# Patient Record
Sex: Male | Born: 1960 | Race: White | Hispanic: No | Marital: Single | Smoking: Never smoker
Health system: Southern US, Community
[De-identification: ages and names within clinical notes are randomized; demographics above are authoritative.]

## PROBLEM LIST (undated history)

## (undated) DIAGNOSIS — E119 Type 2 diabetes mellitus without complications: Secondary | ICD-10-CM

## (undated) DIAGNOSIS — I10 Essential (primary) hypertension: Secondary | ICD-10-CM

## (undated) DIAGNOSIS — K219 Gastro-esophageal reflux disease without esophagitis: Secondary | ICD-10-CM

---

## 2005-10-04 ENCOUNTER — Emergency Department: Payer: Self-pay | Admitting: Emergency Medicine

## 2011-12-04 ENCOUNTER — Emergency Department: Payer: Self-pay

## 2014-03-15 ENCOUNTER — Ambulatory Visit: Payer: Self-pay | Admitting: Family Medicine

## 2015-05-10 ENCOUNTER — Ambulatory Visit
Admission: RE | Admit: 2015-05-10 | Discharge: 2015-05-10 | Disposition: A | Discharge status: Home / Self Care | Payer: BLUE CROSS/BLUE SHIELD | Source: Ambulatory Visit | Attending: Preventative Medicine | Admitting: Preventative Medicine

## 2015-05-10 ENCOUNTER — Other Ambulatory Visit: Payer: Self-pay | Admitting: Preventative Medicine

## 2015-05-10 DIAGNOSIS — M79601 Pain in right arm: Secondary | ICD-10-CM

## 2016-02-03 IMAGING — CR RIGHT FOOT COMPLETE - 3+ VIEW
1 series · 3 of 3 positions shown · non-contrast
Comparison: None.

CLINICAL DATA: RIGHT foot pain. Initial encounter. Foot pain for 2
weeks. No injury. Lateral foot pain.

EXAM:
RIGHT FOOT COMPLETE - 3+ VIEW

[Series 1: dxr foot rt complete w/obliques · 0.14mm/px · 3 of 3 slices shown]
[im 1/3]
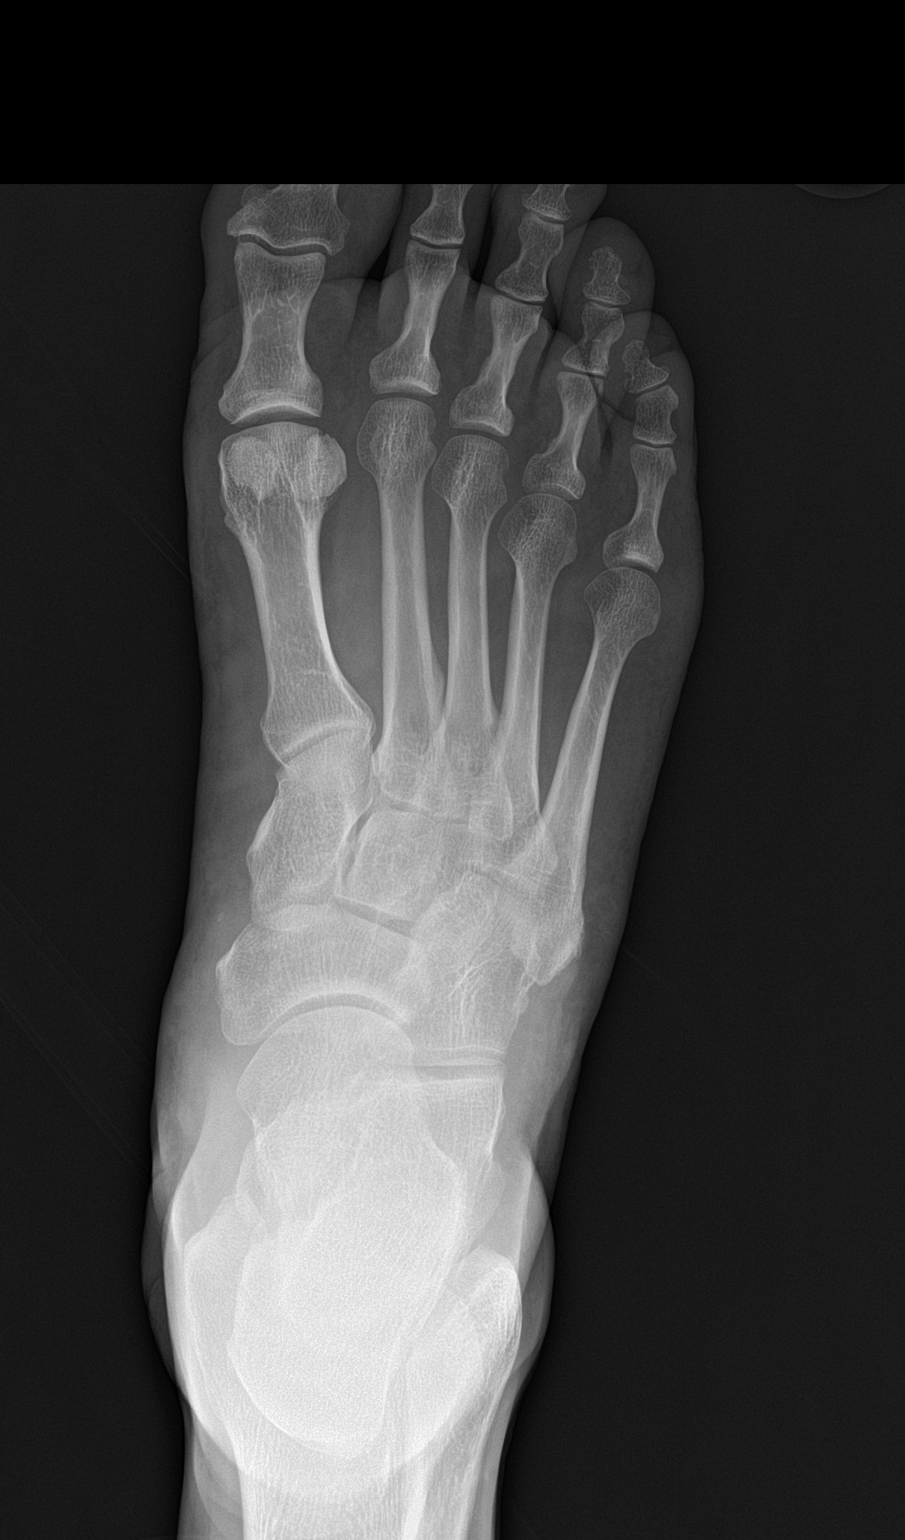
[im 2/3]
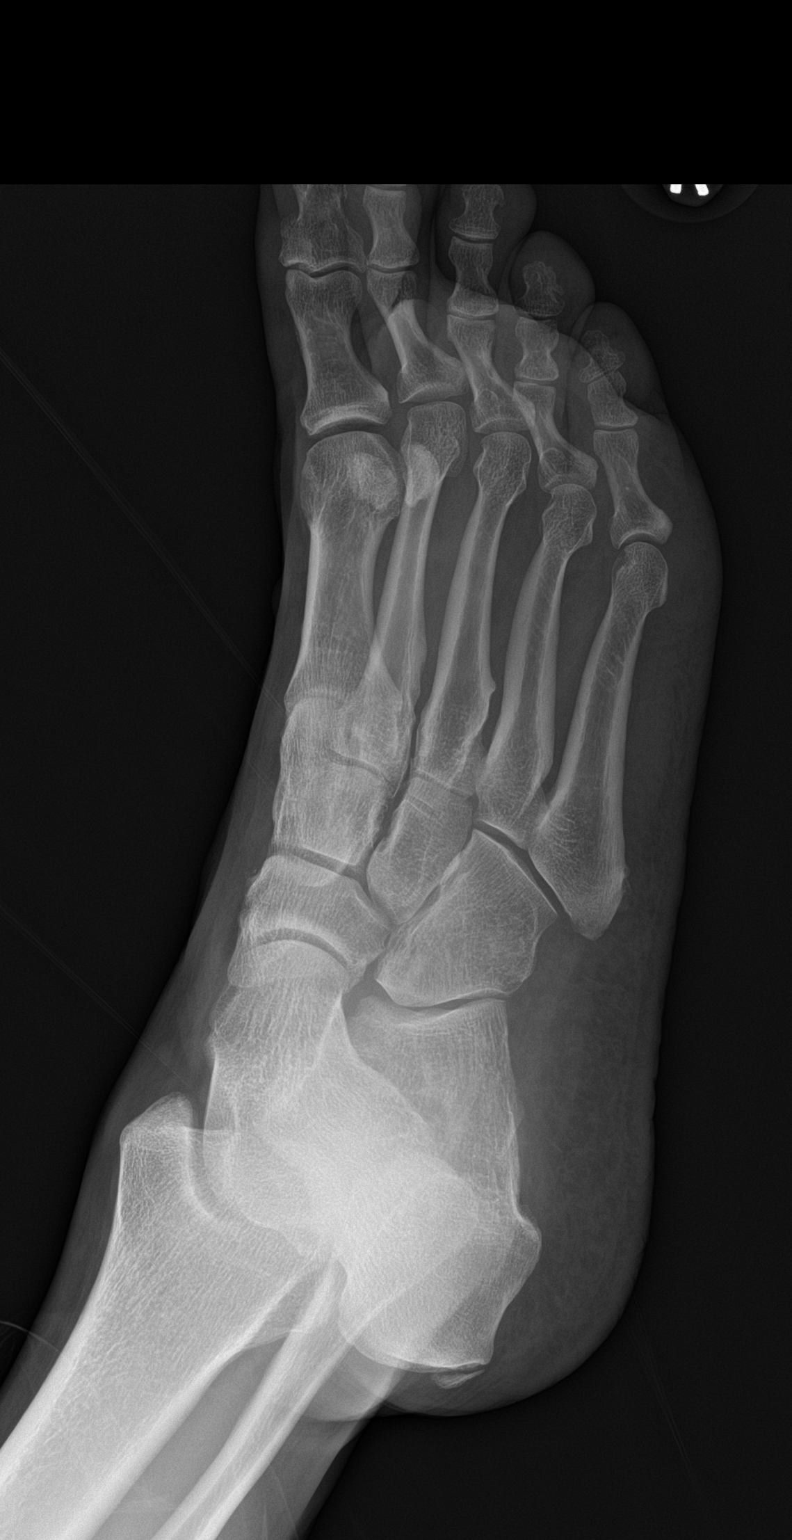
[im 3/3]
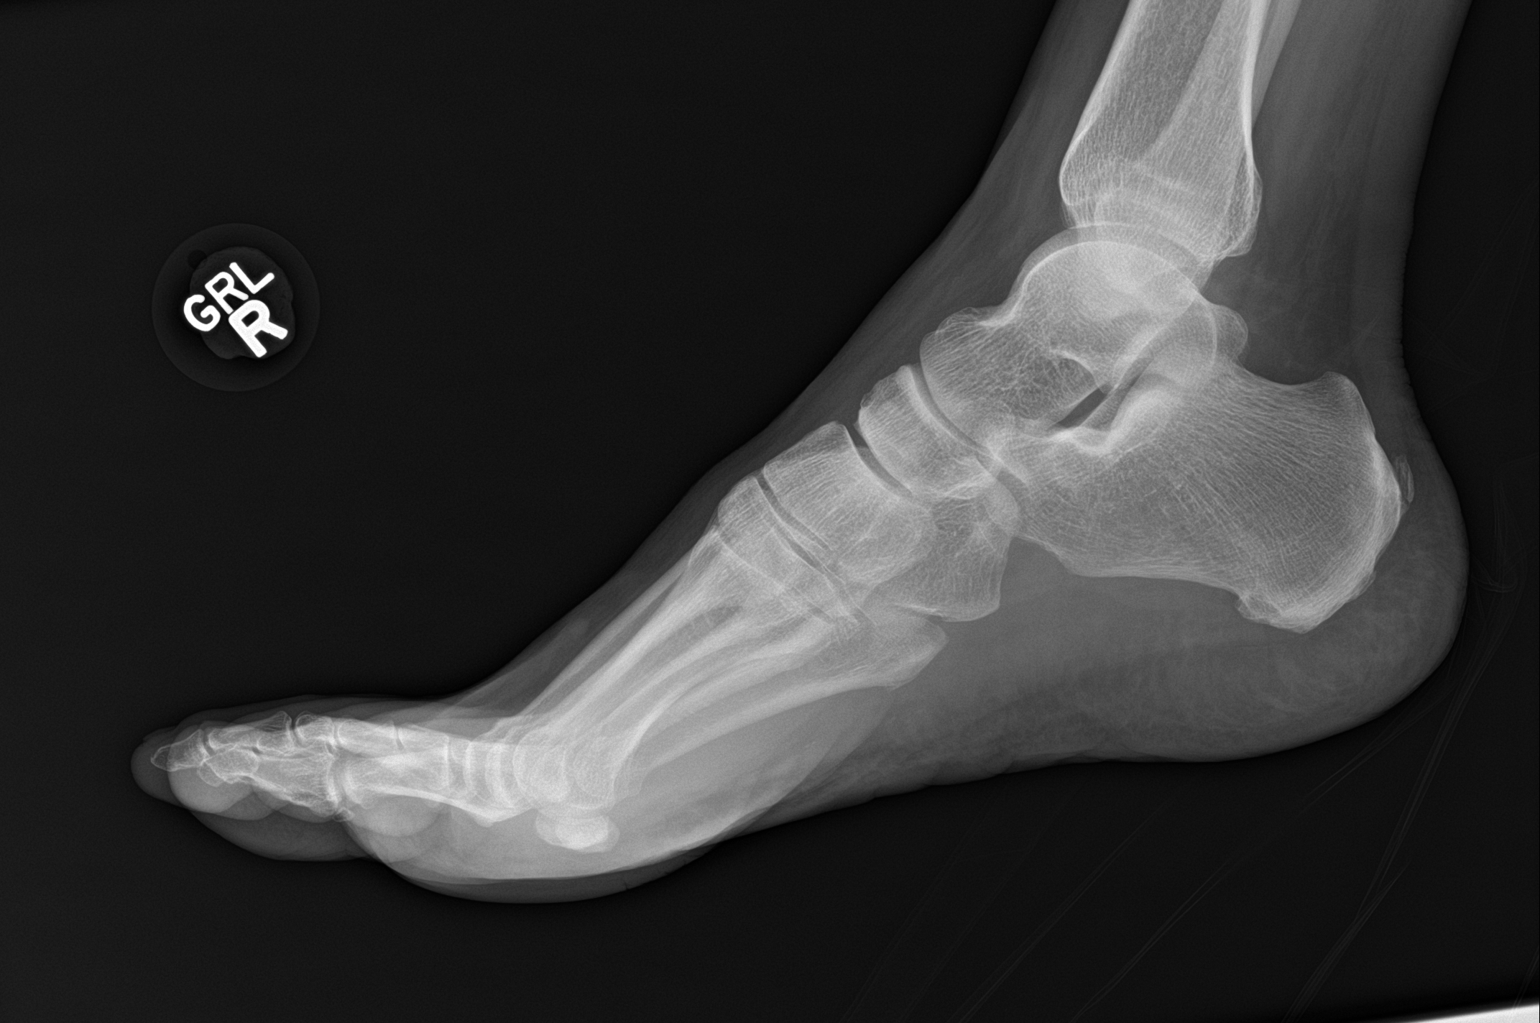

[3 of 3 positions shown; findings below may reference images not displayed]

FINDINGS: Negative for fracture or acute osseous abnormality. The alignment is
anatomic. Joint spaces are preserved. Soft tissues appear within
normal limits. No periosteal reaction or stress fracture identified
in the metatarsals.
IMPRESSION: Negative.

## 2017-03-30 IMAGING — CR DG HUMERUS 2V *R*
1 series · 2 of 2 positions shown · non-contrast
Comparison: None.

CLINICAL DATA: Lateral left humeral pain for 4-5 weeks. No known
injury.

EXAM:
RIGHT HUMERUS - 2+ VIEW

[Series 1: dg humerus right · 0.14mm/px · 2 of 2 slices shown]
[im 1/2]
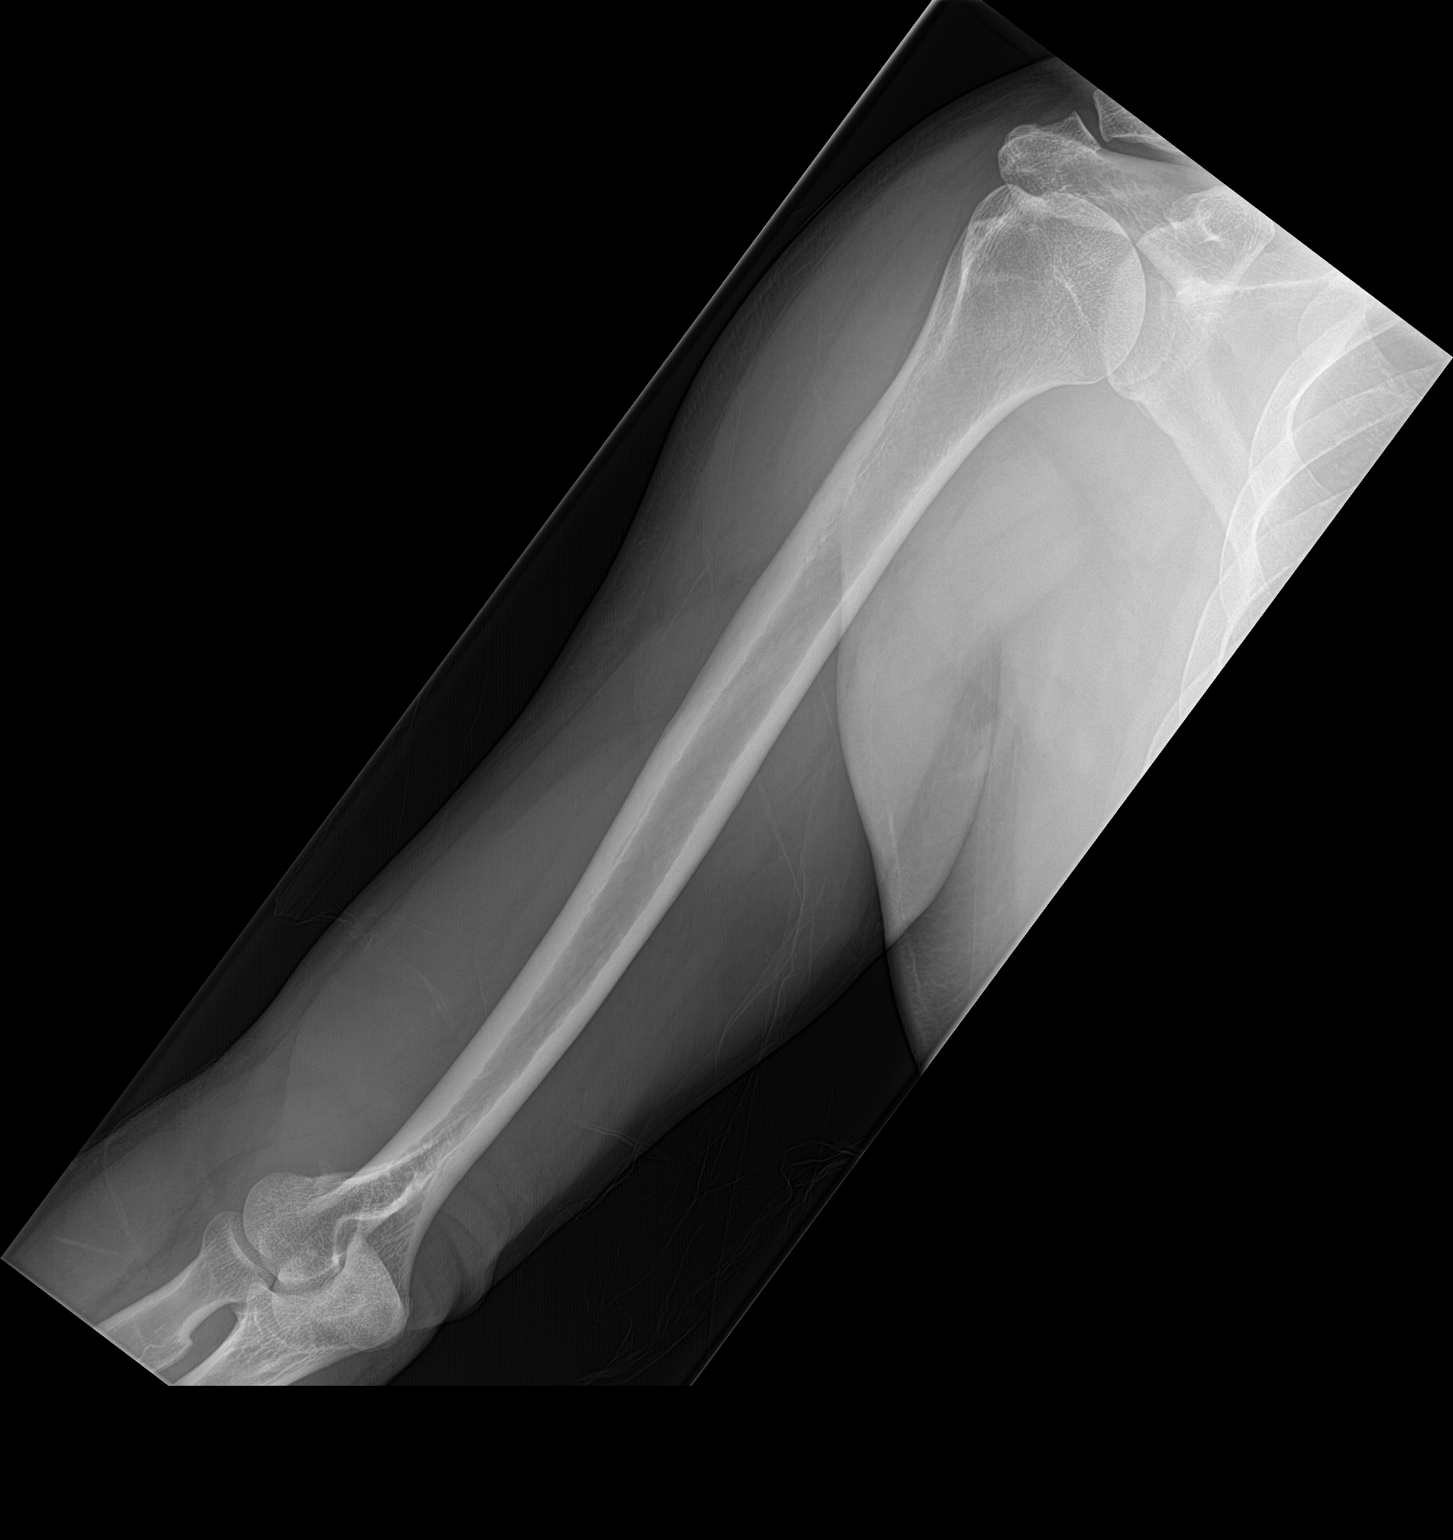
[im 2/2]
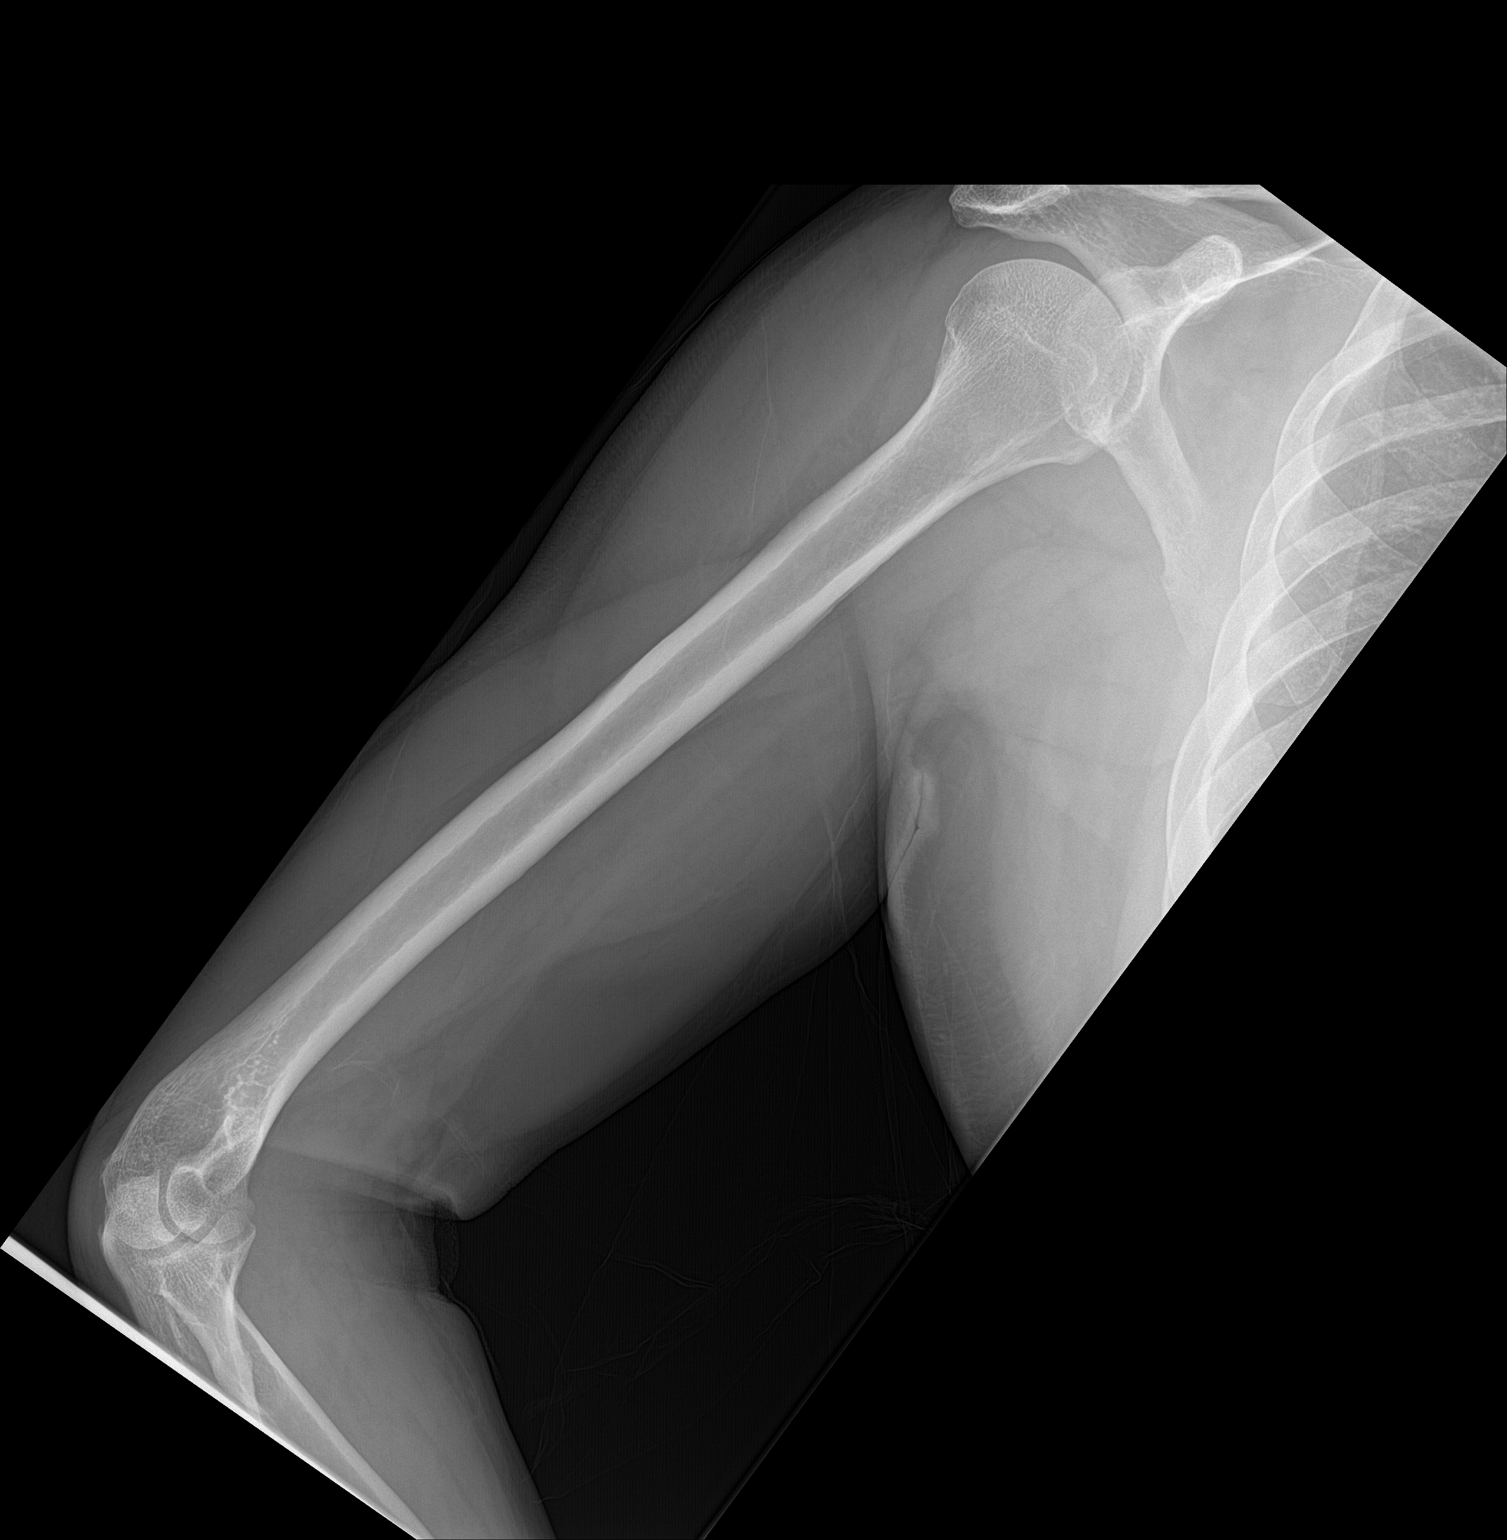

[2 of 2 positions shown; findings below may reference images not displayed]

FINDINGS: There is no evidence of fracture or other focal bone lesions. Soft
tissues are unremarkable.
IMPRESSION: Negative.

## 2017-08-19 ENCOUNTER — Emergency Department: Payer: BLUE CROSS/BLUE SHIELD

## 2017-08-19 ENCOUNTER — Emergency Department
Admission: EM | Admit: 2017-08-19 | Discharge: 2017-08-19 | Disposition: A | Discharge status: Home / Self Care | Payer: BLUE CROSS/BLUE SHIELD | Attending: Emergency Medicine | Admitting: Emergency Medicine

## 2017-08-19 ENCOUNTER — Encounter: Payer: Self-pay | Admitting: Emergency Medicine

## 2017-08-19 ENCOUNTER — Other Ambulatory Visit: Payer: Self-pay

## 2017-08-19 DIAGNOSIS — R05 Cough: Secondary | ICD-10-CM | POA: Diagnosis not present

## 2017-08-19 DIAGNOSIS — R059 Cough, unspecified: Secondary | ICD-10-CM

## 2017-08-19 HISTORY — DX: Gastro-esophageal reflux disease without esophagitis: K21.9

## 2017-08-19 MED ORDER — HYDROCOD POLST-CPM POLST ER 10-8 MG/5ML PO SUER
5.0000 mL | Freq: Once | ORAL | Status: AC
Start: 2017-08-19 — End: 2017-08-19
  Administered 2017-08-19: 5 mL via ORAL
  Filled 2017-08-19: qty 5

## 2017-08-19 MED ORDER — HYDROCOD POLST-CPM POLST ER 10-8 MG/5ML PO SUER: 5.0000 mL | Freq: Two times a day (BID) | ORAL | 0 refills | Status: DC

## 2017-08-19 NOTE — ED Notes (Signed)
Patient transported to X-ray 

## 2017-08-19 NOTE — ED Triage Notes (Signed)
Pt in via POV with complaints of persistent cough since Friday.  Pt denies any accompanying symptoms.  NAD noted at this time.

## 2017-08-19 NOTE — ED Notes (Signed)
Pt states cough since Friday, denies production, states he took nyquil without relief. Pt states some watery eyes and head pain when coughing, denies smoking hx. Pt states sometimes he felt he may have had a fever but is unsure. Pt in NAD at this time, equal and unlabored resp noted.

## 2017-08-19 NOTE — ED Provider Notes (Signed)
[facility] Emergency Department Provider Note   ____________________________________________   First MD Initiated Contact with Patient 08/19/17 0235     (approximate)  I have reviewed the triage vital signs and the nursing notes.   HISTORY  Chief Complaint Cough    HPI Carl Crawford is a 57 y.o. male who presents to the ED from home with a chief complaint of cough.  Patient reports persistent and nonproductive cough x3 days.  Also notes some sinus congestion secondary to seasonal allergies.  Denies associated fever, chills, chest pain, shortness of breath, abdominal pain, nausea or vomiting.  Denies recent travel or trauma.   Past Medical History:  Diagnosis Date  . Acid reflux disease     There are no active problems to display for this patient.   History reviewed. No pertinent surgical history.  Prior to Admission medications   Medication Sig Start Date End Date Taking? Authorizing Provider  chlorpheniramine-HYDROcodone (TUSSIONEX PENNKINETIC ER) 10-8 MG/5ML SUER Take 5 mLs by mouth 2 (two) times daily. 08/19/17   Paulette Blanch, MD    Allergies Patient has no known allergies.  No family history on file.  Social History Social History   Tobacco Use  . Smoking status: Never Smoker  . Smokeless tobacco: Never Used  Substance Use Topics  . Alcohol use: Not Currently  . Drug use: Not Currently    Review of Systems  Constitutional: No fever/chills Eyes: No visual changes. ENT: Positive for sinus congestion.  No sore throat. Cardiovascular: Denies chest pain. Respiratory: Positive for dry cough.  Denies shortness of breath. Gastrointestinal: No abdominal pain.  No nausea, no vomiting.  No diarrhea.  No constipation. Genitourinary: Negative for dysuria. Musculoskeletal: Negative for back pain. Skin: Negative for rash. Neurological: Negative for headaches, focal weakness or  numbness.   ____________________________________________   PHYSICAL EXAM:  VITAL SIGNS: ED Triage Vitals  Enc Vitals Group     BP 08/19/17 0017 (!) 173/99     Pulse Rate 08/19/17 0017 84     Resp 08/19/17 0017 17     Temp 08/19/17 0017 99.3 F (37.4 C)     Temp Source 08/19/17 0017 Oral     SpO2 08/19/17 0017 97 %     Weight 08/19/17 0010 206 lb (93.4 kg)     Height 08/19/17 0010 6\' 1"  (1.854 m)     Head Circumference --      Peak Flow --      Pain Score 08/19/17 0010 0     Pain Loc --      Pain Edu? --      Excl. in [place]? --     Constitutional: Alert and oriented. Well appearing and in no acute distress. Eyes: Conjunctivae are normal. PERRL. EOMI. Head: Atraumatic. Nose: No congestion/rhinnorhea. Mouth/Throat: Mucous membranes are moist.  Oropharynx non-erythematous. Neck: No stridor.  Supple neck without meningismus. Cardiovascular: Normal rate, regular rhythm. Grossly normal heart sounds.  Good peripheral circulation. Respiratory: Normal respiratory effort.  No retractions. Lungs CTAB.  No cough noted. Gastrointestinal: Soft and nontender. No distention. No abdominal bruits. No CVA tenderness. Musculoskeletal: No lower extremity tenderness nor edema.  No joint effusions. Neurologic:  Normal speech and language. No gross focal neurologic deficits are appreciated. No gait instability. Skin:  Skin is warm, dry and intact. No rash noted. Psychiatric: Mood and affect are normal. Speech and behavior are normal.  ____________________________________________   LABS (all labs ordered are listed, but only abnormal results are displayed)  Labs Reviewed - No data to display ____________________________________________  EKG  None ____________________________________________  RADIOLOGY  ED MD interpretation: No acute cardiopulmonary process  Official radiology report(s): Dg Chest 2 View  Result Date: 08/19/2017 CLINICAL DATA:  Persistent cough since Friday. EXAM:  CHEST - 2 VIEW COMPARISON:  None. FINDINGS: The heart size and mediastinal contours are within normal limits. Both lungs are clear. The visualized skeletal structures are unremarkable. IMPRESSION: No active cardiopulmonary disease. Electronically Signed   By: Lucienne Capers M.D.   On: 08/19/2017 01:15    ____________________________________________   PROCEDURES  Procedure(s) performed: None  Procedures  Critical Care performed: No  ____________________________________________   INITIAL IMPRESSION / ASSESSMENT AND PLAN / ED COURSE  As part of my medical decision making, I reviewed the following data within the [place] notes reviewed and incorporated, Old chart reviewed, Radiograph reviewed and Notes from prior ED visits   57 year old male who presents with dry cough x3 days.  Advised patient to start a daily allergy pill.  Will discharge home with prescription for Tussionex to use as needed.  Strict return precautions given.  Patient verbalizes understanding agrees with plan of care.      ____________________________________________   FINAL CLINICAL IMPRESSION(S) / ED DIAGNOSES  Final diagnoses:  Cough     ED Discharge Orders        Ordered    chlorpheniramine-HYDROcodone (TUSSIONEX PENNKINETIC ER) 10-8 MG/5ML SUER  2 times daily     08/19/17 0245       Note:  This document was prepared using Dragon voice recognition software and may include unintentional dictation errors.    Paulette Blanch, MD 08/19/17 0500

## 2017-08-19 NOTE — Discharge Instructions (Addendum)
1.  Start a daily over-the-counter allergy medicine (Zyrtec, Claritin, Allegra, etc.). 2.  You may take Tussionex as needed for cough. 3.  Return to the ER for worsening symptoms, persistent vomiting, difficulty breathing or other concerns.

## 2017-08-19 NOTE — ED Notes (Signed)

## 2021-05-10 ENCOUNTER — Encounter: Payer: Self-pay | Admitting: Physician Assistant

## 2021-05-10 ENCOUNTER — Ambulatory Visit (INDEPENDENT_AMBULATORY_CARE_PROVIDER_SITE_OTHER): Payer: 59 | Admitting: Physician Assistant

## 2021-05-10 VITALS — BP 158/94 | HR 68 | Temp 97.9°F | Resp 16 | Ht 73.0 in | Wt 212.0 lb

## 2021-05-10 DIAGNOSIS — I10 Essential (primary) hypertension: Secondary | ICD-10-CM | POA: Diagnosis not present

## 2021-05-10 DIAGNOSIS — Z125 Encounter for screening for malignant neoplasm of prostate: Secondary | ICD-10-CM

## 2021-05-10 DIAGNOSIS — R194 Change in bowel habit: Secondary | ICD-10-CM

## 2021-05-10 DIAGNOSIS — Z1211 Encounter for screening for malignant neoplasm of colon: Secondary | ICD-10-CM | POA: Diagnosis not present

## 2021-05-10 DIAGNOSIS — Z6827 Body mass index (BMI) 27.0-27.9, adult: Secondary | ICD-10-CM

## 2021-05-10 DIAGNOSIS — Z0000 Encounter for general adult medical examination without abnormal findings: Secondary | ICD-10-CM | POA: Diagnosis not present

## 2021-05-10 DIAGNOSIS — L918 Other hypertrophic disorders of the skin: Secondary | ICD-10-CM | POA: Diagnosis not present

## 2021-05-10 DIAGNOSIS — Z8249 Family history of ischemic heart disease and other diseases of the circulatory system: Secondary | ICD-10-CM | POA: Diagnosis not present

## 2021-05-10 MED ORDER — LOSARTAN POTASSIUM 25 MG PO TABS: 25.0000 mg | ORAL_TABLET | Freq: Every day | ORAL | 1 refills | Status: DC

## 2021-05-10 MED ORDER — TRIAMCINOLONE ACETONIDE 0.1 % EX CREA: 1.0000 "application " | TOPICAL_CREAM | Freq: Two times a day (BID) | CUTANEOUS | 2 refills | Status: AC

## 2021-05-10 NOTE — Assessment & Plan Note (Signed)
Many across chest/neck, underarms.  ?As they are itchy--rx triamcinolone cream  ?Ref to derm ?

## 2021-05-10 NOTE — Assessment & Plan Note (Signed)
May be within his normal? ?Advised caution with fatty/greasy/heavy foods ?Will do labs ?Ref to GI as needs baseline colonoscopy ?

## 2021-05-10 NOTE — Assessment & Plan Note (Signed)
Recommending lipid panel, pt is fasting, a1c, cmp ?

## 2021-05-10 NOTE — Progress Notes (Signed)
? ? ?I,Roshena L Chambers,acting as a scribe for [employer], PA-C.,have documented all relevant documentation on the behalf of Carl Kirschner, PA-C,as directed by  Carl Kirschner, PA-C while in the presence of Carl Kirschner, PA-C.  ? ?New patient visit ? ? ?Patient: Carl Crawford   DOB: 11/03/1960   61 y.o. Male  MRN: [id number] ?Visit Date: 05/10/2021 ? ?Today's healthcare provider: [name], PA-C  ? ?Chief Complaint  ?Patient presents with  ? Establish Care  ? Skin Tag  ? ?Subjective  ?  ?Carl Crawford is a 61 y.o. male who presents today as a new patient to establish care. He reports his last PCP was at [facility] clinic. His last visit at [facility] clinic was several years ago. Reports history of high blood pressure, once on medication. ? ?HPI  ?Skin Tags: ?Patient has several skin tags on his neck that itch. He would like to discuss having then removed. Denies any other predominant lesions. ? ?Reports frequent BM for the last several months-years. He will have solid, 'normal' BM 4-5 times a day. Seems to happen more with greasy food. Denies light colored or dark/black stools, denies visible blood, denies diarrhea, denies abdominal pain/cramping.  ? ?Past Medical History:  ?Diagnosis Date  ? Acid reflux disease   ? ?History reviewed. No pertinent surgical history. ?Family Status  ?Relation Name Status  ? Father  (Not Specified)  ? Sister  (Not Specified)  ? MGF  (Not Specified)  ? ?Family History  ?Problem Relation Age of Onset  ? Heart attack Father   ? Cancer Sister   ? Cirrhosis Sister   ? Heart attack Maternal Grandfather   ? ?Social History  ? ?Socioeconomic History  ? Marital status: Single  ?  Spouse name: Not on file  ? Number of children: Not on file  ? Years of education: Not on file  ? Highest education level: Not on file  ?Occupational History  ? Not on file  ?Tobacco Use  ? Smoking status: Never  ? Smokeless tobacco: Never  ?Vaping Use  ? Vaping Use: Never used  ?Substance and Sexual Activity  ?  Alcohol use: Never  ? Drug use: Not Currently  ? Sexual activity: Not on file  ?Other Topics Concern  ? Not on file  ?Social History Narrative  ? Not on file  ? ?Social Determinants of Health  ? ?Financial Resource Strain: Not on file  ?Food Insecurity: Not on file  ?Transportation Needs: Not on file  ?Physical Activity: Not on file  ?Stress: Not on file  ?Social Connections: Not on file  ? ?Outpatient Medications Prior to Visit  ?Medication Sig  ? esomeprazole (NEXIUM) 20 MG capsule Take 20 mg by mouth daily at 12 noon.  ? Multiple Vitamins-Minerals (MENS MULTIVITAMIN PO) Take by mouth.  ? [DISCONTINUED] chlorpheniramine-HYDROcodone (TUSSIONEX PENNKINETIC ER) 10-8 MG/5ML SUER Take 5 mLs by mouth 2 (two) times daily.  ? ?No facility-administered medications prior to visit.  ? ?No Known Allergies ? ? ?There is no immunization history on file for this patient. ? ?Health Maintenance  ?Topic Date Due  ? COVID-19 Vaccine (1) Never done  ? HIV Screening  Never done  ? Hepatitis C Screening  Never done  ? TETANUS/TDAP  Never done  ? COLONOSCOPY (Pts 45-52yr Insurance coverage will need to be confirmed)  Never done  ? Zoster Vaccines- Shingrix (1 of 2) Never done  ? INFLUENZA VACCINE  08/28/2021  ? HPV VACCINES  Aged Out  ? ? ?  Patient Care Team: ?Emelia Loron as PCP - General (Physician Assistant) ? ?Review of Systems  ?Constitutional:  Negative for appetite change, chills, fatigue and fever.  ?HENT:  Negative for congestion, ear pain, hearing loss, nosebleeds and trouble swallowing.   ?Eyes:  Negative for pain and visual disturbance.  ?Respiratory:  Negative for cough, chest tightness and shortness of breath.   ?Cardiovascular:  Negative for chest pain, palpitations and leg swelling.  ?Gastrointestinal:  Negative for abdominal pain, blood in stool, constipation, diarrhea, nausea and vomiting.  ?Endocrine: Negative for polydipsia, polyphagia and polyuria.  ?Genitourinary:  Negative for dysuria and flank pain.   ?Musculoskeletal:  Negative for arthralgias, back pain, joint swelling, myalgias and neck stiffness.  ?Skin:  Negative for color change, rash and wound.  ?     Multiple skin tags  ?Neurological:  Negative for dizziness, tremors, seizures, speech difficulty, weakness, light-headedness and headaches.  ?Psychiatric/Behavioral:  Negative for behavioral problems, confusion, decreased concentration, dysphoric mood and sleep disturbance. The patient is not nervous/anxious.   ?All other systems reviewed and are negative. ? ? ? Objective  ?  ?BP (!) 158/94   Pulse 68   Temp 97.9 ?F (36.6 ?C) (Oral)   Resp 16   Ht '6\' 1"'$  (1.854 m)   Wt 212 lb (96.2 kg)   SpO2 98% Comment: room air  BMI 27.97 kg/m?  ? ?Physical Exam ?Constitutional:   ?   General: He is awake.  ?   Appearance: He is well-developed.  ?HENT:  ?   Head: Normocephalic.  ?Eyes:  ?   Conjunctiva/sclera: Conjunctivae normal.  ?Cardiovascular:  ?   Rate and Rhythm: Normal rate and regular rhythm.  ?   Heart sounds: Normal heart sounds.  ?Pulmonary:  ?   Effort: Pulmonary effort is normal.  ?   Breath sounds: Normal breath sounds.  ?Musculoskeletal:  ?   Right lower leg: No edema.  ?   Left lower leg: No edema.  ?Skin: ?   General: Skin is warm.  ?   Comments: Multiple small skin tags across neck  ?Neurological:  ?   Mental Status: He is alert and oriented to person, place, and time.  ?Psychiatric:     ?   Attention and Perception: Attention normal.     ?   Mood and Affect: Mood normal.     ?   Speech: Speech normal.     ?   Behavior: Behavior is cooperative.  ? ? ?Depression Screen ? ?  05/10/2021  ?  9:04 AM  ?PHQ 2/9 Scores  ?PHQ - 2 Score 0  ?PHQ- 9 Score 0  ? ?No results found for any visits on 05/10/21. ? Assessment & Plan   ?  ? ?Problem List Items Addressed This Visit   ? ?  ? Cardiovascular and Mediastinum  ? Primary hypertension  ?  Historically, been unmediated. ?Starting losartan 25 mg daily, advised at bedtime.  ?Pt not able to check BP at home ?F/u  4 weeks ?  ?  ? Relevant Medications  ? losartan (COZAAR) 25 MG tablet  ? Other Relevant Orders  ? Lipid Profile  ? Comprehensive Metabolic Panel (CMET)  ?  ? Musculoskeletal and Integument  ? Skin tags, multiple acquired  ?  Many across chest/neck, underarms.  ?As they are itchy--rx triamcinolone cream  ?Ref to derm ?  ?  ? Relevant Medications  ? triamcinolone cream (KENALOG) 0.1 %  ? Other Relevant Orders  ? Ambulatory referral to  Dermatology  ?  ? Other  ? Family history of heart disease  ?  Recommending lipid panel, pt is fasting, a1c, cmp ?  ?  ? Relevant Orders  ? Lipid Profile  ? Frequent bowel movements  ?  May be within his normal? ?Advised caution with fatty/greasy/heavy foods ?Will do labs ?Ref to GI as needs baseline colonoscopy ?  ?  ? ?Other Visit Diagnoses   ? ? Encounter for medical examination to establish care    -  Primary  ? Relevant Medications  ? losartan (COZAAR) 25 MG tablet  ? Other Relevant Orders  ? CBC w/Diff/Platelet  ? Lipid Profile  ? HgB A1c  ? Comprehensive Metabolic Panel (CMET)  ? Ambulatory referral to Dermatology  ? PSA  ? BMI 27.0-27.9,adult      ? Relevant Orders  ? Lipid Profile  ? HgB A1c  ? Screening for prostate cancer      ? Relevant Orders  ? PSA  ? Colon cancer screening      ? Relevant Orders  ? Ambulatory referral to Gastroenterology  ? ?  ?  ?Return in about 4 weeks (around 06/07/2021) for hypertension.  ? We will attempt to obtain prior vaccine history with other pcps ? ?I, Carl Kirschner, PA-C have reviewed all documentation for this visit. The documentation on  05/10/2021 for the exam, diagnosis, procedures, and orders are all accurate and complete. ? ?Carl Kirschner, PA-C ?[facility] ?[address] ?[place], [place], [address] ?Office: [phone] ?Fax: [phone]  ? ?[facility]  ?

## 2021-05-10 NOTE — Assessment & Plan Note (Addendum)
Historically, been unmedicated. Elevated today initially and on repeat. ?Starting losartan 25 mg daily, advised at bedtime.  ?Pt not able to check BP at home ?F/u 4 weeks ?

## 2021-05-11 ENCOUNTER — Telehealth: Payer: Self-pay

## 2021-05-11 LAB — CBC WITH DIFFERENTIAL/PLATELET
Basophils Absolute: 0 10*3/uL (ref 0.0–0.2)
Basos: 1 %
EOS (ABSOLUTE): 0.1 10*3/uL (ref 0.0–0.4)
Eos: 2 %
Hematocrit: 46.6 % (ref 37.5–51.0)
Hemoglobin: 16.8 g/dL (ref 13.0–17.7)
Immature Grans (Abs): 0 10*3/uL (ref 0.0–0.1)
Immature Granulocytes: 0 %
Lymphocytes Absolute: 1.6 10*3/uL (ref 0.7–3.1)
Lymphs: 33 %
MCH: 30.9 pg (ref 26.6–33.0)
MCHC: 36.1 g/dL — ABNORMAL HIGH (ref 31.5–35.7)
MCV: 86 fL (ref 79–97)
Monocytes Absolute: 0.6 10*3/uL (ref 0.1–0.9)
Monocytes: 11 %
Neutrophils Absolute: 2.6 10*3/uL (ref 1.4–7.0)
Neutrophils: 53 %
Platelets: 283 10*3/uL (ref 150–450)
RBC: 5.43 x10E6/uL (ref 4.14–5.80)
RDW: 12.4 % (ref 11.6–15.4)
WBC: 4.9 10*3/uL (ref 3.4–10.8)

## 2021-05-11 LAB — LIPID PANEL
Chol/HDL Ratio: 5.5 ratio — ABNORMAL HIGH (ref 0.0–5.0)
Cholesterol, Total: 248 mg/dL — ABNORMAL HIGH (ref 100–199)
HDL: 45 mg/dL (ref >39)
LDL Chol Calc (NIH): 186 mg/dL — ABNORMAL HIGH (ref 0–99)
Triglycerides: 97 mg/dL (ref 0–149)
VLDL Cholesterol Cal: 17 mg/dL (ref 5–40)

## 2021-05-11 LAB — COMPREHENSIVE METABOLIC PANEL
ALT: 90 IU/L — ABNORMAL HIGH (ref 0–44)
AST: 54 IU/L — ABNORMAL HIGH (ref 0–40)
Albumin/Globulin Ratio: 1.8 (ref 1.2–2.2)
Albumin: 5 g/dL — ABNORMAL HIGH (ref 3.8–4.8)
Alkaline Phosphatase: 92 IU/L (ref 44–121)
BUN/Creatinine Ratio: 9 — ABNORMAL LOW (ref 10–24)
BUN: 7 mg/dL — ABNORMAL LOW (ref 8–27)
Bilirubin Total: 0.4 mg/dL (ref 0.0–1.2)
CO2: 22 mmol/L (ref 20–29)
Calcium: 9.9 mg/dL (ref 8.6–10.2)
Chloride: 102 mmol/L (ref 96–106)
Creatinine, Ser: 0.82 mg/dL (ref 0.76–1.27)
Globulin, Total: 2.8 g/dL (ref 1.5–4.5)
Glucose: 125 mg/dL — ABNORMAL HIGH (ref 70–99)
Potassium: 5 mmol/L (ref 3.5–5.2)
Sodium: 143 mmol/L (ref 134–144)
Total Protein: 7.8 g/dL (ref 6.0–8.5)
eGFR: 99 mL/min/{1.73_m2} (ref >59)

## 2021-05-11 LAB — PSA: Prostate Specific Ag, Serum: 0.5 ng/mL (ref 0.0–4.0)

## 2021-05-11 LAB — HEMOGLOBIN A1C
Est. average glucose Bld gHb Est-mCnc: 131 mg/dL
Hgb A1c MFr Bld: 6.2 % — ABNORMAL HIGH (ref 4.8–5.6)

## 2021-05-11 NOTE — Telephone Encounter (Signed)
CALLED PATIENT NO ANSWER LEFT VOICEMAIL FOR A CALL BACK ? ?

## 2021-05-14 ENCOUNTER — Other Ambulatory Visit: Payer: Self-pay | Admitting: Physician Assistant

## 2021-05-14 ENCOUNTER — Telehealth: Payer: Self-pay

## 2021-05-14 DIAGNOSIS — E782 Mixed hyperlipidemia: Secondary | ICD-10-CM

## 2021-05-14 MED ORDER — ATORVASTATIN CALCIUM 20 MG PO TABS: 20.0000 mg | ORAL_TABLET | Freq: Every day | ORAL | 3 refills | Status: DC

## 2021-05-14 NOTE — Telephone Encounter (Signed)
LVM for pt to return my call.

## 2021-05-15 ENCOUNTER — Other Ambulatory Visit: Payer: Self-pay

## 2021-05-15 DIAGNOSIS — Z1211 Encounter for screening for malignant neoplasm of colon: Secondary | ICD-10-CM

## 2021-05-15 MED ORDER — PEG 3350-KCL-NA BICARB-NACL 420 G PO SOLR: 4000.0000 mL | Freq: Once | ORAL | 0 refills | Status: AC

## 2021-05-15 NOTE — Progress Notes (Signed)
Gastroenterology Pre-Procedure Review ? ?Request Date: 05/23/2021 ?Requesting Physician: Dr. Vicente Males ? ?PATIENT REVIEW QUESTIONS: The patient responded to the following health history questions as indicated:   ? ?1. Are you having any GI issues? no ?2. Do you have a personal history of Polyps? no ?3. Do you have a family history of Colon Cancer or Polyps? yes (both) ?4. Diabetes Mellitus? no ?5. Joint replacements in the past 12 months?no ?6. Major health problems in the past 3 months?no ?7. Any artificial heart valves, MVP, or defibrillator?no ?   ?MEDICATIONS & ALLERGIES:    ?Patient reports the following regarding taking any anticoagulation/antiplatelet therapy:   ?Plavix, Coumadin, Eliquis, Xarelto, Lovenox, Pradaxa, Brilinta, or Effient? no ?Aspirin? no ? ?Patient confirms/reports the following medications:  ?Current Outpatient Medications  ?Medication Sig Dispense Refill  ? atorvastatin (LIPITOR) 20 MG tablet Take 1 tablet (20 mg total) by mouth daily. 90 tablet 3  ? esomeprazole (NEXIUM) 20 MG capsule Take 20 mg by mouth daily at 12 noon.    ? esomeprazole (NEXIUM) 20 MG capsule Take by mouth.    ? losartan (COZAAR) 25 MG tablet Take 1 tablet (25 mg total) by mouth daily. 90 tablet 1  ? Multiple Vitamins-Minerals (MENS MULTIVITAMIN PO) Take by mouth.    ? triamcinolone cream (KENALOG) 0.1 % Apply 1 application. topically 2 (two) times daily. On areas that itch 45 g 2  ? ?No current facility-administered medications for this visit.  ? ? ?Patient confirms/reports the following allergies:  ?No Known Allergies ? ?No orders of the defined types were placed in this encounter. ? ? ?AUTHORIZATION INFORMATION ?Primary Insurance: ?1D#: ?Group #: ? ?Secondary Insurance: ?1D#: ?Group #: ? ?SCHEDULE INFORMATION: ?Date: 05/23/2021 ?Time: ?Location:[facility] ? ?

## 2021-05-16 ENCOUNTER — Telehealth: Payer: Self-pay | Admitting: *Deleted

## 2021-05-16 NOTE — Telephone Encounter (Signed)
Result note read to pt. States will make 3 month F/U appt at next appt (for BP) in May. Would like dietary recommendations noted mailed. Agreeable to atorvastatin, advised has been called in to pharmacy. ?

## 2021-05-17 ENCOUNTER — Encounter: Payer: Self-pay | Admitting: Gastroenterology

## 2021-05-22 ENCOUNTER — Telehealth: Payer: Self-pay

## 2021-05-22 NOTE — Telephone Encounter (Signed)
Called and answered all patients questions ?

## 2021-05-23 ENCOUNTER — Encounter: Payer: Self-pay | Admitting: Gastroenterology

## 2021-05-23 ENCOUNTER — Ambulatory Visit: Payer: 59 | Admitting: Certified Registered Nurse Anesthetist

## 2021-05-23 ENCOUNTER — Encounter
Admission: RE | Disposition: A | Discharge status: Home / Self Care | Payer: Self-pay | Source: Home / Self Care | Attending: Gastroenterology

## 2021-05-23 ENCOUNTER — Ambulatory Visit
Admission: RE | Admit: 2021-05-23 | Discharge: 2021-05-23 | Disposition: A | Discharge status: Home / Self Care | Payer: 59 | Attending: Gastroenterology | Admitting: Gastroenterology

## 2021-05-23 DIAGNOSIS — D120 Benign neoplasm of cecum: Secondary | ICD-10-CM | POA: Diagnosis not present

## 2021-05-23 DIAGNOSIS — D126 Benign neoplasm of colon, unspecified: Secondary | ICD-10-CM | POA: Diagnosis not present

## 2021-05-23 DIAGNOSIS — Z79899 Other long term (current) drug therapy: Secondary | ICD-10-CM | POA: Insufficient documentation

## 2021-05-23 DIAGNOSIS — I10 Essential (primary) hypertension: Secondary | ICD-10-CM | POA: Insufficient documentation

## 2021-05-23 DIAGNOSIS — K219 Gastro-esophageal reflux disease without esophagitis: Secondary | ICD-10-CM | POA: Insufficient documentation

## 2021-05-23 DIAGNOSIS — Z1211 Encounter for screening for malignant neoplasm of colon: Secondary | ICD-10-CM | POA: Diagnosis not present

## 2021-05-23 DIAGNOSIS — D123 Benign neoplasm of transverse colon: Secondary | ICD-10-CM | POA: Diagnosis not present

## 2021-05-23 DIAGNOSIS — K635 Polyp of colon: Secondary | ICD-10-CM | POA: Insufficient documentation

## 2021-05-23 DIAGNOSIS — D125 Benign neoplasm of sigmoid colon: Secondary | ICD-10-CM | POA: Diagnosis not present

## 2021-05-23 HISTORY — DX: Essential (primary) hypertension: I10

## 2021-05-23 HISTORY — PX: COLONOSCOPY WITH PROPOFOL: SHX5780

## 2021-05-23 SURGERY — COLONOSCOPY WITH PROPOFOL
Anesthesia: General

## 2021-05-23 MED ORDER — LIDOCAINE HCL (CARDIAC) PF 100 MG/5ML IV SOSY
PREFILLED_SYRINGE | INTRAVENOUS | Status: DC | PRN
  Administered 2021-05-23: 50 mg via INTRAVENOUS

## 2021-05-23 MED ORDER — PROPOFOL 500 MG/50ML IV EMUL
INTRAVENOUS | Status: DC | PRN
  Administered 2021-05-23: 150 ug/kg/min via INTRAVENOUS

## 2021-05-23 MED ORDER — PROPOFOL 10 MG/ML IV BOLUS
INTRAVENOUS | Status: DC | PRN
  Administered 2021-05-23: 80 mg via INTRAVENOUS

## 2021-05-23 MED ORDER — STERILE WATER FOR IRRIGATION IR SOLN
Status: DC | PRN
  Administered 2021-05-23: 60 mL

## 2021-05-23 MED ORDER — SODIUM CHLORIDE 0.9 % IV SOLN: INTRAVENOUS | Status: DC

## 2021-05-23 NOTE — Anesthesia Preprocedure Evaluation (Signed)
Anesthesia Evaluation  ?Patient identified by MRN, date of birth, ID band ?Patient awake ? ? ? ?Reviewed: ?Allergy & Precautions, NPO status , Patient's Chart, lab work & pertinent test results ? ?History of Anesthesia Complications ?Negative for: history of anesthetic complications ? ?Airway ?Mallampati: III ? ?TM Distance: >3 FB ?Neck ROM: Full ? ? ? Dental ? ?(+) Poor Dentition, Chipped ?  ?Pulmonary ?neg pulmonary ROS, neg sleep apnea, neg COPD, Patient abstained from smoking.Not current smoker,  ?  ?Pulmonary exam normal ?breath sounds clear to auscultation ? ? ? ? ? ? Cardiovascular ?Exercise Tolerance: Good ?METShypertension, Pt. on medications ?(-) CAD and (-) Past MI (-) dysrhythmias  ?Rhythm:Regular Rate:Normal ?- Systolic murmurs ? ?  ?Neuro/Psych ?negative neurological ROS ? negative psych ROS  ? GI/Hepatic ?GERD  Medicated and Controlled,(+)  ?  ? (-) substance abuse ? ,   ?Endo/Other  ?neg diabetes ? Renal/GU ?negative Renal ROS  ? ?  ?Musculoskeletal ? ? Abdominal ?  ?Peds ? Hematology ?  ?Anesthesia Other Findings ?Past Medical History: ?No date: Acid reflux disease ?No date: Hypertension ? Reproductive/Obstetrics ? ?  ? ? ? ? ? ? ? ? ? ? ? ? ? ?  ?  ? ? ? ? ? ? ? ? ?Anesthesia Physical ?Anesthesia Plan ? ?ASA: 2 ? ?Anesthesia Plan: General  ? ?Post-op Pain Management: Minimal or no pain anticipated  ? ?Induction: Intravenous ? ?PONV Risk Score and Plan: 2 and Propofol infusion, TIVA and Ondansetron ? ?Airway Management Planned: Nasal Cannula ? ?Additional Equipment: None ? ?Intra-op Plan:  ? ?Post-operative Plan:  ? ?Informed Consent: I have reviewed the patients History and Physical, chart, labs and discussed the procedure including the risks, benefits and alternatives for the proposed anesthesia with the patient or authorized representative who has indicated his/her understanding and acceptance.  ? ? ? ?Dental advisory given ? ?Plan Discussed with: CRNA and  Surgeon ? ?Anesthesia Plan Comments: (Discussed risks of anesthesia with patient, including possibility of difficulty with spontaneous ventilation under anesthesia necessitating airway intervention, PONV, and rare risks such as cardiac or respiratory or neurological events, and allergic reactions. Discussed the role of CRNA in patient's perioperative care. Patient understands.)  ? ? ? ? ? ? ?Anesthesia Quick Evaluation ? ?

## 2021-05-23 NOTE — H&P (Signed)
? ? ? ?Jonathon Bellows, MD ?[address], [place], [place], [place], [address] ?[address], [place], [place], [place], [address] ?Phone: [phone]  ?Fax: [phone] ? ?Primary Care Physician:  [name], PA-C ? ? ?Pre-Procedure History & Physical: ?HPI:  Carl Crawford is a 61 y.o. male is here for an colonoscopy. ?  ?Past Medical History:  ?Diagnosis Date  ? Acid reflux disease   ? Hypertension   ? ? ?History reviewed. No pertinent surgical history. ? ?Prior to Admission medications   ?Medication Sig Start Date End Date Taking? Authorizing Provider  ?atorvastatin (LIPITOR) 20 MG tablet Take 1 tablet (20 mg total) by mouth daily. 05/14/21  Yes [name], PA-C  ?esomeprazole (NEXIUM) 20 MG capsule Take 20 mg by mouth daily at 12 noon.   Yes [provider]  ?losartan (COZAAR) 25 MG tablet Take 1 tablet (25 mg total) by mouth daily. 05/10/21  Yes [name], PA-C  ?Multiple Vitamins-Minerals (MENS MULTIVITAMIN PO) Take by mouth.   Yes [provider]  ?triamcinolone cream (KENALOG) 0.1 % Apply 1 application. topically 2 (two) times daily. On areas that itch 05/10/21  Yes Drubel, Ria Comment, PA-C  ?esomeprazole (NEXIUM) 20 MG capsule Take by mouth.    [provider]  ? ? ?Allergies as of 05/15/2021  ? (No Known Allergies)  ? ? ?Family History  ?Problem Relation Age of Onset  ? Heart attack Father   ? Cancer Sister   ? Cirrhosis Sister   ? Heart attack Maternal Grandfather   ? ? ?Social History  ? ?Socioeconomic History  ? Marital status: Single  ?  Spouse name: Not on file  ? Number of children: Not on file  ? Years of education: Not on file  ? Highest education level: Not on file  ?Occupational History  ? Not on file  ?Tobacco Use  ? Smoking status: Never  ? Smokeless tobacco: Never  ?Vaping Use  ? Vaping Use: Never used  ?Substance and Sexual Activity  ? Alcohol use: Never  ? Drug use: Not Currently  ? Sexual activity: Not on file  ?Other Topics Concern  ? Not on file   ?Social History Narrative  ? Not on file  ? ?Social Determinants of Health  ? ?Financial Resource Strain: Not on file  ?Food Insecurity: Not on file  ?Transportation Needs: Not on file  ?Physical Activity: Not on file  ?Stress: Not on file  ?Social Connections: Not on file  ?Intimate Partner Violence: Not on file  ? ? ?Review of Systems: ?See HPI, otherwise negative ROS ? ?Physical Exam: ?BP (!) 167/100   Pulse 81   Temp (!) 96.9 ?F (36.1 ?C) (Temporal)   Ht '6\' 1"'$  (1.854 m)   Wt 96.4 kg   SpO2 100%   BMI 28.03 kg/m?  ?General:   Alert,  pleasant and cooperative in NAD ?Head:  Normocephalic and atraumatic. ?Neck:  Supple; no masses or thyromegaly. ?Lungs:  Clear throughout to auscultation, normal respiratory effort.    ?Heart:  +S1, +S2, Regular rate and rhythm, No edema. ?Abdomen:  Soft, nontender and nondistended. Normal bowel sounds, without guarding, and without rebound.   ?Neurologic:  Alert and  oriented x4;  grossly normal neurologically. ? ?Impression/Plan: ?[name] is here for an colonoscopy to be performed for Screening colonoscopy average risk   ?Risks, benefits, limitations, and alternatives regarding  colonoscopy have been reviewed with the patient.  Questions have been answered.  All parties agreeable. ? ? ?Bailey Mech  Vicente Males, MD  05/23/2021, 11:15 AM ? ?

## 2021-05-23 NOTE — Anesthesia Postprocedure Evaluation (Signed)
Anesthesia Post Note ? ?Patient: Carl Crawford ? ?Procedure(s) Performed: COLONOSCOPY WITH PROPOFOL ? ?Patient location during evaluation: Endoscopy ?Anesthesia Type: General ?Level of consciousness: awake and alert ?Pain management: pain level controlled ?Vital Signs Assessment: post-procedure vital signs reviewed and stable ?Respiratory status: spontaneous breathing, nonlabored ventilation, respiratory function stable and patient connected to nasal cannula oxygen ?Cardiovascular status: blood pressure returned to baseline and stable ?Postop Assessment: no apparent nausea or vomiting ?Anesthetic complications: no ? ? ?No notable events documented. ? ? ?Last Vitals:  ?Vitals:  ? 05/23/21 1207 05/23/21 1217  ?BP: 138/88 (!) 152/88  ?Pulse:    ?Resp: 20 20  ?Temp:    ?SpO2:    ?  ?Last Pain:  ?Vitals:  ? 05/23/21 1217  ?TempSrc:   ?PainSc: 0-No pain  ? ? ?  ?  ?  ?  ?  ?  ? ?Arita Miss ? ? ? ? ?

## 2021-05-23 NOTE — Transfer of Care (Signed)
Immediate Anesthesia Transfer of Care Note ? ?Patient: Carl Crawford ? ?Procedure(s) Performed: COLONOSCOPY WITH PROPOFOL ? ?Patient Location: PACU ? ?Anesthesia Type:General ? ?Level of Consciousness: sedated ? ?Airway & Oxygen Therapy: Patient Spontanous Breathing ? ?Post-op Assessment: Report given to RN and Post -op Vital signs reviewed and stable ? ?Post vital signs: Reviewed and stable ? ?Last Vitals:  ?Vitals Value Taken Time  ?BP 106/73 05/23/21 1158  ?Temp    ?Pulse 70 05/23/21 1158  ?Resp 11 05/23/21 1158  ?SpO2 98 % 05/23/21 1158  ? ? ?Last Pain:  ?Vitals:  ? 05/23/21 1058  ?TempSrc: Temporal  ?PainSc: 0-No pain  ?   ? ?  ? ?Complications: No notable events documented. ?

## 2021-05-23 NOTE — Op Note (Signed)
[facility] ?Gastroenterology ?Patient Name: Carl Crawford ?Procedure Date: 05/23/2021 11:24 AM ?MRN: [id number] ?Account #: [id number] ?Date of Birth: 1960-03-19 ?Admit Type: Outpatient ?Age: 61 ?Room: [facility] ENDO ROOM 3 ?Gender: Male ?Note Status: Finalized ?Instrument Name: Colonoscope [id number] ?Procedure:             Colonoscopy ?Indications:           Screening for colorectal malignant neoplasm ?Providers:             Jonathon Bellows MD, MD ?Referring MD:          [facility], MD (Referring MD) ?Medicines:             Monitored Anesthesia Care ?Complications:         No immediate complications. ?Procedure:             Pre-Anesthesia Assessment: ?                       - Prior to the procedure, a History and Physical was  ?                       performed, and patient medications, allergies and  ?                       sensitivities were reviewed. The patient's tolerance  ?                       of previous anesthesia was reviewed. ?                       - The risks and benefits of the procedure and the  ?                       sedation options and risks were discussed with the  ?                       patient. All questions were answered and informed  ?                       consent was obtained. ?                       - ASA Grade Assessment: II - A patient with mild  ?                       systemic disease. ?                       After obtaining informed consent, the colonoscope was  ?                       passed under direct vision. Throughout the procedure,  ?                       the patient's blood pressure, pulse, and oxygen  ?                       saturations were monitored continuously. The  ?  Colonoscope was introduced through the anus and  ?                       advanced to the the cecum, identified by the  ?                       appendiceal orifice. The colonoscopy was performed  ?                       with ease. The patient tolerated  the procedure well.  ?                       The quality of the bowel preparation was excellent. ?Findings: ?     The perianal and digital rectal examinations were normal. ?     Two sessile polyps were found in the sigmoid colon. The polyps were 6 to  ?     8 mm in size. These polyps were removed with a cold snare. Resection and  ?     retrieval were complete. To prevent bleeding post-intervention, one  ?     hemostatic clip was successfully placed. There was no bleeding at the  ?     end of the procedure. ?     Two sessile polyps were found in the transverse colon. The polyps were  ?     10 to 15 mm in size. Preparations were made for mucosal resection.  ?     Saline was injected to raise the lesion. Snare mucosal resection was  ?     performed. Resection and retrieval were complete. To prevent bleeding  ?     after mucosal resection, two hemostatic clips were successfully placed.  ?     There was no bleeding during, or at the end, of the procedure. borders  ?     of polyp marked using blue light or nbi light ?     A 5 mm polyp was found in the ascending colon. The polyp was sessile.  ?     The polyp was removed with a cold snare. Resection and retrieval were  ?     complete. ?     Two sessile polyps were found in the cecum. The polyps were 5 to 7 mm in  ?     size. These polyps were removed with a cold snare. Resection and  ?     retrieval were complete. ?     The exam was otherwise without abnormality on direct and retroflexion  ?     views. ?Impression:            - Two 6 to 8 mm polyps in the sigmoid colon, removed  ?                       with a cold snare. Resected and retrieved. Clip was  ?                       placed. ?                       - Two 10 to 15 mm polyps in the transverse colon,  ?                       removed with mucosal  resection. Resected and  ?                       retrieved. Clips were placed. ?                       - One 5 mm polyp in the ascending colon, removed with  ?                        a cold snare. Resected and retrieved. ?                       - Two 5 to 7 mm polyps in the cecum, removed with a  ?                       cold snare. Resected and retrieved. ?                       - The examination was otherwise normal on direct and  ?                       retroflexion views. ?                       - Mucosal resection was performed. Resection and  ?                       retrieval were complete. ?Recommendation:        - Discharge patient to home (with escort). ?                       - Resume previous diet. ?                       - Continue present medications. ?                       - Await pathology results. ?                       - Repeat colonoscopy for surveillance based on  ?                       pathology results. ?Procedure Code(s):     --- Professional --- ?                       [phone], Colonoscopy, flexible; with endoscopic mucosal  ?                       resection ?                       30865, 59, Colonoscopy, flexible; with removal of  ?                       tumor(s), polyp(s), or other lesion(s) by snare  ?                       technique ?Diagnosis Code(s):     --- Professional --- ?  K63.5, Polyp of colon ?                       Z12.11, Encounter for screening for malignant neoplasm  ?                       of colon ?CPT copyright 2019 American Medical Association. All rights reserved. ?The codes documented in this report are preliminary and upon coder review may  ?be revised to meet current compliance requirements. ?Jonathon Bellows, MD ?Jonathon Bellows MD, MD ?05/23/2021 11:53:52 AM ?This report has been signed electronically. ?Number of Addenda: 0 ?Note Initiated On: 05/23/2021 11:24 AM ?Scope Withdrawal Time: 0 hours 21 minutes 11 seconds  ?Total Procedure Duration: 0 hours 22 minutes 21 seconds  ?Estimated Blood Loss:  Estimated blood loss: none. ?     [facility] ?

## 2021-05-23 NOTE — Anesthesia Procedure Notes (Signed)
Date/Time: 05/23/2021 11:26 AM ?Performed by: Johnna Acosta, CRNA ?Pre-anesthesia Checklist: Patient identified, Emergency Drugs available, Suction available, Patient being monitored and Timeout performed ?Patient Re-evaluated:Patient Re-evaluated prior to induction ?Oxygen Delivery Method: Nasal cannula ?Preoxygenation: Pre-oxygenation with 100% oxygen ?Induction Type: IV induction ? ? ? ? ?

## 2021-05-24 ENCOUNTER — Encounter: Payer: Self-pay | Admitting: Gastroenterology

## 2021-05-24 LAB — SURGICAL PATHOLOGY

## 2021-06-12 NOTE — Progress Notes (Addendum)
?  ? ?I,Sha'taria Tyson,acting as a Education administrator for [employer], PA-C.,have documented all relevant documentation on the behalf of [name], PA-C,as directed by  [name], PA-C while in the presence of [name], PA-C. ? ? ?Established patient visit ? ? ?Patient: Carl Crawford   DOB: 04-21-1960   61 y.o. Male  MRN: [id number] ?Visit Date: 06/13/2021 ? ?Today's healthcare provider: [name], PA-C  ? ?Cc. HTN f/u ? ?Subjective  ?  ?HPI  ? ?Again reports frequent bowel movement, 3-4 times a day. Reports some associated cramping. ?Diet is as follows: ?Breakfast--cereal ?Lunch--fast food sandwich/fries ?Dinner- burger,veggie, chicken patty, sweet potato fries, sometimes a frozen veggie ?Denies alcohol use. ? ?Hypertension, follow-up ? ?BP Readings from Last 3 Encounters:  ?06/13/21 (!) 183/95  ?05/23/21 (!) 152/88  ?05/10/21 (!) 158/94  ? Wt Readings from Last 3 Encounters:  ?06/13/21 207 lb 14.4 oz (94.3 kg)  ?05/23/21 212 lb 7 oz (96.4 kg)  ?05/10/21 212 lb (96.2 kg)  ?  ? ?He was last seen for hypertension 1 months ago.  ?BP at that visit was 158/94. Management since that visit includes starting losartan 25 mg daily, advised at bedtime. ? ?He reports excellent compliance with treatment. ?He is not having side effects.  ?He is following a Regular diet. ?He is not exercising. ?He does not smoke. ? ?Use of agents associated with hypertension: none.  ? ?Outside blood pressures are not being checked. ?Symptoms: ?No chest pain No chest pressure  ?No palpitations No syncope  ?No dyspnea No orthopnea  ?No paroxysmal nocturnal dyspnea No lower extremity edema  ? ?Pertinent labs ?Lab Results  ?Component Value Date  ? CHOL 248 (H) 05/10/2021  ? HDL 45 05/10/2021  ? LDLCALC 186 (H) 05/10/2021  ? TRIG 97 05/10/2021  ? CHOLHDL 5.5 (H) 05/10/2021  ? Lab Results  ?Component Value Date  ? NA 143 05/10/2021  ? K 5.0 05/10/2021  ? CREATININE 0.82 05/10/2021  ? EGFR 99 05/10/2021  ? GLUCOSE 125 (H) 05/10/2021  ?   ? ?The 10-year ASCVD risk score (Arnett DK, et al., 2019) is: 26.5% ? ?--------------------------------------------------------------------------------------------------- ? ? ?Medications: ?Outpatient Medications Prior to Visit  ?Medication Sig  ? atorvastatin (LIPITOR) 20 MG tablet Take 1 tablet (20 mg total) by mouth daily.  ? esomeprazole (NEXIUM) 20 MG capsule Take 20 mg by mouth daily at 12 noon.  ? esomeprazole (NEXIUM) 20 MG capsule Take by mouth.  ? IBUPROFEN PO Take by mouth.  ? Multiple Vitamins-Minerals (MENS MULTIVITAMIN PO) Take by mouth.  ? triamcinolone cream (KENALOG) 0.1 % Apply 1 application. topically 2 (two) times daily. On areas that itch  ? [DISCONTINUED] losartan (COZAAR) 25 MG tablet Take 1 tablet (25 mg total) by mouth daily.  ? ?No facility-administered medications prior to visit.  ? ? ?Review of Systems  ?Constitutional:  Negative for fatigue and fever.  ?Respiratory:  Negative for cough and shortness of breath.   ?Cardiovascular:  Negative for chest pain, palpitations and leg swelling.  ?Neurological:  Negative for dizziness and headaches.  ? ? ?  Objective  ?  ?Blood pressure (!) 183/95, pulse 69, height $RemoveBe'6\' 1"'ZAgjYKlVs$  (1.854 m), weight 207 lb 14.4 oz (94.3 kg), SpO2 98 %.  ? ?Physical Exam ?Constitutional:   ?   General: He is awake.  ?   Appearance: He is well-developed.  ?HENT:  ?   Head: Normocephalic.  ?Eyes:  ?   Conjunctiva/sclera: Conjunctivae normal.  ?Cardiovascular:  ?   Rate and  Rhythm: Normal rate and regular rhythm.  ?   Heart sounds: Normal heart sounds.  ?Pulmonary:  ?   Effort: Pulmonary effort is normal.  ?   Breath sounds: Normal breath sounds.  ?Skin: ?   General: Skin is warm.  ?Neurological:  ?   Mental Status: He is alert and oriented to person, place, and time.  ?Psychiatric:     ?   Attention and Perception: Attention normal.     ?   Mood and Affect: Mood normal.     ?   Speech: Speech normal.     ?   Behavior: Behavior is cooperative.  ?  ? ?No results found for any  visits on 06/13/21. ? Assessment & Plan  ?  ? ?Problem List Items Addressed This Visit   ? ?  ? Cardiovascular and Mediastinum  ? Primary hypertension - Primary  ?  Consistent w/ losartan 25 mg daily.  ?Not checking outside pressures.  ?Still elevated in office. ?Increase losartan to 50 mg, can double what he takes now.  ?F/u 4-6 weeks ? ? ?  ?  ? Relevant Medications  ? losartan (COZAAR) 50 MG tablet  ?  ? Other  ? Frequent bowel movements  ?  Colonoscopy was normal. Pt states he did not mention to GI ?Advised he try metamucil, sounds diet related, low in fiber/veggies ? ?  ?  ?  ? ?Return in about 6 weeks (around 07/25/2021) for hypertension.  ?   ? ?I, [name], PA-C have reviewed all documentation for this visit. The documentation on  06/13/2021 for the exam, diagnosis, procedures, and orders are all accurate and complete. ? ?[name], PA-C ?[facility] ?[address] ?[place], [place], [address] ?Office: [phone] ?Fax: [phone]  ? ?[facility] ? ?

## 2021-06-13 ENCOUNTER — Ambulatory Visit (INDEPENDENT_AMBULATORY_CARE_PROVIDER_SITE_OTHER): Payer: 59 | Admitting: Physician Assistant

## 2021-06-13 ENCOUNTER — Encounter: Payer: Self-pay | Admitting: Physician Assistant

## 2021-06-13 VITALS — BP 183/95 | HR 69 | Ht 73.0 in | Wt 207.9 lb

## 2021-06-13 DIAGNOSIS — R194 Change in bowel habit: Secondary | ICD-10-CM

## 2021-06-13 DIAGNOSIS — I10 Essential (primary) hypertension: Secondary | ICD-10-CM | POA: Diagnosis not present

## 2021-06-13 MED ORDER — LOSARTAN POTASSIUM 50 MG PO TABS: 50.0000 mg | ORAL_TABLET | Freq: Every day | ORAL | 1 refills | Status: DC

## 2021-06-13 NOTE — Patient Instructions (Signed)
[place]  ?[address] ?[place], [place] [address] ?Phone: [phone] ?

## 2021-06-13 NOTE — Assessment & Plan Note (Addendum)
Consistent w/ losartan 25 mg daily.  ?Not checking outside pressures.  ?Still elevated in office. ?Increase losartan to 50 mg, can double what he takes now.  ?F/u 4-6 weeks ? ?

## 2021-06-13 NOTE — Assessment & Plan Note (Signed)
Colonoscopy was normal. Pt states he did not mention to GI ?Advised he try metamucil, sounds diet related, low in fiber/veggies ?

## 2021-07-10 ENCOUNTER — Ambulatory Visit: Payer: 59 | Admitting: Podiatry

## 2021-07-10 DIAGNOSIS — M722 Plantar fascial fibromatosis: Secondary | ICD-10-CM

## 2021-07-11 NOTE — Progress Notes (Signed)
  Subjective:  Patient ID: Carl Crawford, male    DOB: [birth year],  MRN: [id number]  Chief Complaint  Patient presents with   Tinea Pedis    61 y.o. male presents with the above complaint.  Patient presents with complaint of right heel pain that has been going for quite some time is progressive gotten worse.  He states that there is some redness and peeling associate with it as well.  He wanted get it evaluated pain scale 7 out of 10.  He is experiencing post attic dyskinesia type of symptoms.  Hurts with ambulation.   Review of Systems: Negative except as noted in the HPI. Denies N/V/F/Ch.  Past Medical History:  Diagnosis Date   Acid reflux disease    Hypertension     Current Outpatient Medications:    atorvastatin (LIPITOR) 20 MG tablet, Take 1 tablet (20 mg total) by mouth daily., Disp: 90 tablet, Rfl: 3   esomeprazole (NEXIUM) 20 MG capsule, Take 20 mg by mouth daily at 12 noon., Disp: , Rfl:    esomeprazole (NEXIUM) 20 MG capsule, Take by mouth., Disp: , Rfl:    IBUPROFEN PO, Take by mouth., Disp: , Rfl:    losartan (COZAAR) 50 MG tablet, Take 1 tablet (50 mg total) by mouth daily., Disp: 90 tablet, Rfl: 1   Multiple Vitamins-Minerals (MENS MULTIVITAMIN PO), Take by mouth., Disp: , Rfl:    triamcinolone cream (KENALOG) 0.1 %, Apply 1 application. topically 2 (two) times daily. On areas that itch, Disp: 45 g, Rfl: 2  Social History   Tobacco Use  Smoking Status Never  Smokeless Tobacco Never    No Known Allergies Objective:  There were no vitals filed for this visit. There is no height or weight on file to calculate BMI. Constitutional Well developed. Well nourished.  Vascular Dorsalis pedis pulses palpable bilaterally. Posterior tibial pulses palpable bilaterally. Capillary refill normal to all digits.  No cyanosis or clubbing noted. Pedal hair growth normal.  Neurologic Normal speech. Oriented to person, place, and time. Epicritic sensation to light touch grossly  present bilaterally.  Dermatologic Nails well groomed and normal in appearance. No open wounds. No skin lesions.  Orthopedic: Normal joint ROM without pain or crepitus bilaterally. No visible deformities. Tender to palpation at the calcaneal tuber right. No pain with calcaneal squeeze right. Ankle ROM diminished range of motion right. Silfverskiold Test: positive right.   Radiographs: None  Assessment:   1. Plantar fasciitis of right foot    Plan:  Patient was evaluated and treated and all questions answered.  Plantar Fasciitis, right - XR reviewed as above.  - Educated on icing and stretching. Instructions given.  - Injection delivered to the plantar fascia as below. - DME: Plantar fascial brace dispensed to support the medial longitudinal arch of the foot and offload pressure from the heel and prevent arch collapse during weightbearing - Pharmacologic management: None  Procedure: Injection Tendon/Ligament Location: Right plantar fascia at the glabrous junction; medial approach. Skin Prep: alcohol Injectate: 0.5 cc 0.5% marcaine plain, 0.5 cc of 1% Lidocaine, 0.5 cc kenalog 10. Disposition: Patient tolerated procedure well. Injection site dressed with a band-aid.  No follow-ups on file.

## 2021-07-17 NOTE — Progress Notes (Unsigned)
     I,Carl Crawford,acting as a Education administrator for [employer], PA-C.,have documented all relevant documentation on the behalf of Carl Kirschner, PA-C,as directed by  Carl Kirschner, PA-C while in the presence of Carl Kirschner, PA-C.   Established patient visit   Patient: Carl Crawford   DOB: [birth year]   61 y.o. Male  MRN: [id number] Visit Date: 07/18/2021  Today's healthcare provider: [name], PA-C   No chief complaint on file.  Subjective    HPI  Hypertension, follow-up  BP Readings from Last 3 Encounters:  06/13/21 (!) 183/95  05/23/21 (!) 152/88  05/10/21 (!) 158/94   Wt Readings from Last 3 Encounters:  06/13/21 207 lb 14.4 oz (94.3 kg)  05/23/21 212 lb 7 oz (96.4 kg)  05/10/21 212 lb (96.2 kg)     He was last seen for hypertension {NUMBERS 1-12:18279} {days/wks/mos/yrs:310907} ago.  BP at that visit was ***. Management since that visit includes Increase losartan to 50 mg.  He reports {excellent/good/fair/poor:19665} compliance with treatment. He {is/is not:9024} having side effects. {document side effects if present:1} He is following a {diet:21022986} diet. He {is/is not:9024} exercising. He {does/does not:200015} smoke.  Use of agents associated with hypertension: {bp agents assoc with hypertension:511::"none"}.   Outside blood pressures are {***enter patient reported home BP readings, or 'not being checked':1}. Symptoms: {Yes/No:20286} chest pain {Yes/No:20286} chest pressure  {Yes/No:20286} palpitations {Yes/No:20286} syncope  {Yes/No:20286} dyspnea {Yes/No:20286} orthopnea  {Yes/No:20286} paroxysmal nocturnal dyspnea {Yes/No:20286} lower extremity edema   Pertinent labs Lab Results  Component Value Date   CHOL 248 (H) 05/10/2021   HDL 45 05/10/2021   LDLCALC 186 (H) 05/10/2021   TRIG 97 05/10/2021   CHOLHDL 5.5 (H) 05/10/2021   Lab Results  Component Value Date   NA 143 05/10/2021   K 5.0 05/10/2021   CREATININE 0.82 05/10/2021   EGFR 99  05/10/2021   GLUCOSE 125 (H) 05/10/2021     The 10-year ASCVD risk score (Arnett DK, et al., 2019) is: 28.1%  ---------------------------------------------------------------------------------------------------   Medications: Outpatient Medications Prior to Visit  Medication Sig   atorvastatin (LIPITOR) 20 MG tablet Take 1 tablet (20 mg total) by mouth daily.   esomeprazole (NEXIUM) 20 MG capsule Take 20 mg by mouth daily at 12 noon.   esomeprazole (NEXIUM) 20 MG capsule Take by mouth.   IBUPROFEN PO Take by mouth.   losartan (COZAAR) 50 MG tablet Take 1 tablet (50 mg total) by mouth daily.   Multiple Vitamins-Minerals (MENS MULTIVITAMIN PO) Take by mouth.   triamcinolone cream (KENALOG) 0.1 % Apply 1 application. topically 2 (two) times daily. On areas that itch   No facility-administered medications prior to visit.    Review of Systems  {Labs  Heme  Chem  Endocrine  Serology  Results Review (optional):23779}   Objective    There were no vitals taken for this visit. {Show previous vital signs (optional):23777}  Physical Exam  ***  No results found for any visits on 07/18/21.  Assessment & Plan     ***  No follow-ups on file.      {provider attestation***:1}   Carl Kirschner, PA-C  [facility] [phone] (phone) [phone] (fax)  Portal

## 2021-07-18 ENCOUNTER — Encounter: Payer: Self-pay | Admitting: Physician Assistant

## 2021-07-18 ENCOUNTER — Ambulatory Visit (INDEPENDENT_AMBULATORY_CARE_PROVIDER_SITE_OTHER): Payer: 59 | Admitting: Physician Assistant

## 2021-07-18 VITALS — BP 156/89 | HR 69 | Ht 73.0 in | Wt 208.1 lb

## 2021-07-18 DIAGNOSIS — I10 Essential (primary) hypertension: Secondary | ICD-10-CM

## 2021-07-18 DIAGNOSIS — E782 Mixed hyperlipidemia: Secondary | ICD-10-CM | POA: Diagnosis not present

## 2021-07-18 DIAGNOSIS — M722 Plantar fascial fibromatosis: Secondary | ICD-10-CM | POA: Diagnosis not present

## 2021-07-18 MED ORDER — CELECOXIB 100 MG PO CAPS: 100.0000 mg | ORAL_CAPSULE | Freq: Two times a day (BID) | ORAL | 0 refills | Status: DC | PRN

## 2021-07-18 MED ORDER — HYDROCHLOROTHIAZIDE 12.5 MG PO CAPS: 12.5000 mg | ORAL_CAPSULE | Freq: Every day | ORAL | 1 refills | Status: DC

## 2021-07-18 MED ORDER — BLOOD PRESSURE KIT DEVI: 0 refills | Status: DC

## 2021-07-18 NOTE — Assessment & Plan Note (Addendum)
New dose of losartan 50 mg from last visit Still elevated in office, adding hctz 12.5 mg daily advised pt of SE  Advise pt to check at home; will try to get a cuff/machine sent to pharmacy discussed w/ pt talking to pharmacist here, pt is often unclear about which medications he is taking for what condition

## 2021-07-18 NOTE — Assessment & Plan Note (Addendum)
Continue to follow w/ podiatry rx celebrex 100 mg 1-2 times daily advised pt to try to take in AM Will only send 1 month

## 2021-08-09 ENCOUNTER — Ambulatory Visit: Payer: 59 | Admitting: Podiatry

## 2021-08-14 ENCOUNTER — Other Ambulatory Visit: Payer: Self-pay | Admitting: Physician Assistant

## 2021-08-14 DIAGNOSIS — M722 Plantar fascial fibromatosis: Secondary | ICD-10-CM

## 2021-08-16 ENCOUNTER — Ambulatory Visit (INDEPENDENT_AMBULATORY_CARE_PROVIDER_SITE_OTHER): Payer: 59 | Admitting: Podiatry

## 2021-08-16 DIAGNOSIS — M722 Plantar fascial fibromatosis: Secondary | ICD-10-CM | POA: Diagnosis not present

## 2021-08-16 DIAGNOSIS — Q666 Other congenital valgus deformities of feet: Secondary | ICD-10-CM | POA: Diagnosis not present

## 2021-08-22 NOTE — Progress Notes (Signed)
Subjective:  Patient ID: Carl Crawford, male    DOB: [birth year],  MRN: [id number]  No chief complaint on file.   61 y.o. male presents with the above complaint.  Patient presents with follow-up for right Planter fasciitis.  He has noticed some improvement pain is 6 out of 10.  Still some achy residual pain.   Review of Systems: Negative except as noted in the HPI. Denies N/V/F/Ch.  Past Medical History:  Diagnosis Date   Acid reflux disease    Hypertension     Current Outpatient Medications:    atorvastatin (LIPITOR) 20 MG tablet, Take 1 tablet (20 mg total) by mouth daily., Disp: 90 tablet, Rfl: 3   Blood Pressure Monitoring (BLOOD PRESSURE KIT) DEVI, Use to check blood pressure at home daily, Disp: 1 each, Rfl: 0   celecoxib (CELEBREX) 100 MG capsule, Take 1 capsule (100 mg total) by mouth 2 (two) times daily as needed. For foot pain. Start with taking one pill in the morning., Disp: 60 capsule, Rfl: 0   esomeprazole (NEXIUM) 20 MG capsule, Take 20 mg by mouth daily at 12 noon., Disp: , Rfl:    esomeprazole (NEXIUM) 20 MG capsule, Take by mouth., Disp: , Rfl:    hydrochlorothiazide (MICROZIDE) 12.5 MG capsule, Take 1 capsule (12.5 mg total) by mouth daily. For blood pressure, Disp: 90 capsule, Rfl: 1   losartan (COZAAR) 50 MG tablet, Take 1 tablet (50 mg total) by mouth daily., Disp: 90 tablet, Rfl: 1   Multiple Vitamins-Minerals (MENS MULTIVITAMIN PO), Take by mouth., Disp: , Rfl:    triamcinolone cream (KENALOG) 0.1 %, Apply 1 application. topically 2 (two) times daily. On areas that itch, Disp: 45 g, Rfl: 2  Social History   Tobacco Use  Smoking Status Never  Smokeless Tobacco Never    No Known Allergies Objective:  There were no vitals filed for this visit. There is no height or weight on file to calculate BMI. Constitutional Well developed. Well nourished.  Vascular Dorsalis pedis pulses palpable bilaterally. Posterior tibial pulses palpable bilaterally. Capillary  refill normal to all digits.  No cyanosis or clubbing noted. Pedal hair growth normal.  Neurologic Normal speech. Oriented to person, place, and time. Epicritic sensation to light touch grossly present bilaterally.  Dermatologic Nails well groomed and normal in appearance. No open wounds. No skin lesions.  Orthopedic: Normal joint ROM without pain or crepitus bilaterally. No visible deformities. Tender to palpation at the calcaneal tuber right. No pain with calcaneal squeeze right. Ankle ROM diminished range of motion right. Silfverskiold Test: positive right.   Radiographs: None  Assessment:   1. Plantar fasciitis of right foot   2. Pes planovalgus     Plan:  Patient was evaluated and treated and all questions answered.  Plantar Fasciitis, right - XR reviewed as above.  - Educated on icing and stretching. Instructions given.  -Second injection delivered to the plantar fascia as below. - DME: New plantar fascia brace was dispensed the previous 1 was damaged - Pharmacologic management: None  Pes planovalgus -I explained to patient the etiology of pes planovalgus and relationship with Planter fasciitis and various treatment options were discussed.  Given patient foot structure in the setting of Planter fasciitis I believe patient will benefit from custom-made orthotics to help control the hindfoot motion support the arch of the foot and take the stress away from plantar fascial.  Patient agrees with the plan like to proceed with orthotics -Patient was casted for orthotics  Procedure: Injection Tendon/Ligament Location: Right plantar fascia at the glabrous junction; medial approach. Skin Prep: alcohol Injectate: 0.5 cc 0.5% marcaine plain, 0.5 cc of 1% Lidocaine, 0.5 cc kenalog 10. Disposition: Patient tolerated procedure well. Injection site dressed with a band-aid.  No follow-ups on file.

## 2021-08-29 ENCOUNTER — Encounter: Payer: Self-pay | Admitting: Physician Assistant

## 2021-08-29 ENCOUNTER — Ambulatory Visit (INDEPENDENT_AMBULATORY_CARE_PROVIDER_SITE_OTHER): Payer: 59 | Admitting: Physician Assistant

## 2021-08-29 VITALS — BP 157/78 | HR 72 | Wt 211.4 lb

## 2021-08-29 DIAGNOSIS — R748 Abnormal levels of other serum enzymes: Secondary | ICD-10-CM

## 2021-08-29 DIAGNOSIS — R7303 Prediabetes: Secondary | ICD-10-CM

## 2021-08-29 DIAGNOSIS — R251 Tremor, unspecified: Secondary | ICD-10-CM | POA: Diagnosis not present

## 2021-08-29 DIAGNOSIS — I10 Essential (primary) hypertension: Secondary | ICD-10-CM

## 2021-08-29 NOTE — Assessment & Plan Note (Signed)
Still not controlled. Pt does not check at home. Increasing to losartan 100 mg and continue hctz 12.5 mg.  F/u 6 weeks

## 2021-08-29 NOTE — Progress Notes (Signed)
I,Sha'taria Tyson,acting as a Education administrator for [employer], PA-C.,have documented all relevant documentation on the behalf of [name], PA-C,as directed by  [name], PA-C while in the presence of [name], PA-C.   Established patient visit   Patient: Carl Crawford   DOB: 25-Aug-1960   61 y.o. Male  MRN: [id number] Visit Date: 08/29/2021  Today's healthcare provider: [name], PA-C   Cc. Htn f/u  Subjective    HPI  He reports for 'years' he gets shakes and hand sweats. Happens more when he gets mad or upset.  Denies associated chest pain, headaches, dizziness, changes in vision.   Hypertension, follow-up  BP Readings from Last 3 Encounters:  08/29/21 (!) 157/78  07/18/21 (!) 156/89  06/13/21 (!) 183/95   Wt Readings from Last 3 Encounters:  08/29/21 211 lb 6.4 oz (95.9 kg)  07/18/21 208 lb 1.6 oz (94.4 kg)  06/13/21 207 lb 14.4 oz (94.3 kg)     He was last seen for hypertension 6 weeks ago.  BP at that visit was 156/89. Management since that visit includes adding hctz 12.5 mg daily .  He reports excellent compliance with treatment. He is not having side effects.  He is following a Regular diet. He is not exercising. He does not smoke.  Use of agents associated with hypertension: none.   Outside blood pressures are not being checked Symptoms: No chest pain No chest pressure  No palpitations No syncope  No dyspnea No orthopnea  No paroxysmal nocturnal dyspnea No lower extremity edema   Pertinent labs Lab Results  Component Value Date   CHOL 248 (H) 05/10/2021   HDL 45 05/10/2021   LDLCALC 186 (H) 05/10/2021   TRIG 97 05/10/2021   CHOLHDL 5.5 (H) 05/10/2021   Lab Results  Component Value Date   NA 143 05/10/2021   K 5.0 05/10/2021   CREATININE 0.82 05/10/2021   EGFR 99 05/10/2021   GLUCOSE 125 (H) 05/10/2021     The 10-year ASCVD risk score (Arnett DK, et al., 2019) is:  22.2%  ---------------------------------------------------------------------------------------------------   Medications: Outpatient Medications Prior to Visit  Medication Sig   atorvastatin (LIPITOR) 20 MG tablet Take 1 tablet (20 mg total) by mouth daily.   Blood Pressure Monitoring (BLOOD PRESSURE KIT) DEVI Use to check blood pressure at home daily   celecoxib (CELEBREX) 100 MG capsule Take 1 capsule (100 mg total) by mouth 2 (two) times daily as needed. For foot pain. Start with taking one pill in the morning.   esomeprazole (NEXIUM) 20 MG capsule Take 20 mg by mouth daily at 12 noon.   esomeprazole (NEXIUM) 20 MG capsule Take by mouth.   hydrochlorothiazide (MICROZIDE) 12.5 MG capsule Take 1 capsule (12.5 mg total) by mouth daily. For blood pressure   losartan (COZAAR) 50 MG tablet Take 1 tablet (50 mg total) by mouth daily.   Multiple Vitamins-Minerals (MENS MULTIVITAMIN PO) Take by mouth.   triamcinolone cream (KENALOG) 0.1 % Apply 1 application. topically 2 (two) times daily. On areas that itch   No facility-administered medications prior to visit.    Review of Systems  Constitutional:  Negative for fatigue and fever.  Respiratory:  Negative for cough and shortness of breath.   Cardiovascular:  Negative for chest pain, palpitations and leg swelling.  Neurological:  Negative for dizziness and headaches.       Objective    Blood pressure (!) 157/78, pulse 72, weight 211 lb 6.4 oz (95.9 kg).  Physical Exam Constitutional:      General: He is awake.     Appearance: He is well-developed.  HENT:     Head: Normocephalic.  Eyes:     Conjunctiva/sclera: Conjunctivae normal.  Cardiovascular:     Rate and Rhythm: Normal rate and regular rhythm.     Heart sounds: Normal heart sounds.  Pulmonary:     Effort: Pulmonary effort is normal.     Breath sounds: Normal breath sounds.  Skin:    General: Skin is warm.  Neurological:     Mental Status: He is alert and oriented to  person, place, and time.  Psychiatric:        Attention and Perception: Attention normal.        Mood and Affect: Mood normal.        Speech: Speech normal.        Behavior: Behavior is cooperative.      No results found for any visits on 08/29/21.  Assessment & Plan     Problem List Items Addressed This Visit       Cardiovascular and Mediastinum   Primary hypertension - Primary    Still not controlled. Pt does not check at home. Increasing to losartan 100 mg and continue hctz 12.5 mg.  F/u 6 weeks        Other   Shakes    Unsure etiology, seems chronic and unchanged. Hx predm, elevated liver enzymes will recheck      Relevant Orders   Comprehensive Metabolic Panel (CMET)   HgB A1c   Other Visit Diagnoses     Prediabetes       Relevant Orders   HgB A1c   Elevated liver enzymes       Relevant Orders   Comprehensive Metabolic Panel (CMET)        Return in about 6 weeks (around 10/10/2021) for hypertension.      I, [name], PA-C have reviewed all documentation for this visit. The documentation on 08/29/2021 for the exam, diagnosis, procedures, and orders are all accurate and complete.  [name], PA-C [facility] [address] [place], [place], [address] Office: [phone] Fax: [place]

## 2021-08-29 NOTE — Assessment & Plan Note (Signed)
Unsure etiology, seems chronic and unchanged. Hx predm, elevated liver enzymes will recheck

## 2021-09-11 ENCOUNTER — Telehealth: Payer: Self-pay | Admitting: Podiatry

## 2021-09-11 NOTE — Telephone Encounter (Signed)
Lvm for patient to call office and schedule an appointment for insert pick up

## 2021-09-13 ENCOUNTER — Ambulatory Visit (INDEPENDENT_AMBULATORY_CARE_PROVIDER_SITE_OTHER): Payer: 59 | Admitting: Podiatry

## 2021-09-13 DIAGNOSIS — M722 Plantar fascial fibromatosis: Secondary | ICD-10-CM | POA: Diagnosis not present

## 2021-09-13 NOTE — Progress Notes (Signed)
Subjective:  Patient ID: Carl Crawford, male    DOB: 1960/02/26,  MRN: [id number]  Chief Complaint  Patient presents with   Plantar Fasciitis    Right foot follow-up plantar fasciitis.    61 y.o. male presents with the above complaint.  Patient presents for follow-up of right plantar fasciitis he is doing a lot better he does not have any pain.  Injections took care of it.  He denies any other acute complaints is still awaiting orthotics.   Review of Systems: Negative except as noted in the HPI. Denies N/V/F/Ch.  Past Medical History:  Diagnosis Date   Acid reflux disease    Hypertension     Current Outpatient Medications:    atorvastatin (LIPITOR) 20 MG tablet, Take 1 tablet (20 mg total) by mouth daily., Disp: 90 tablet, Rfl: 3   Blood Pressure Monitoring (BLOOD PRESSURE KIT) DEVI, Use to check blood pressure at home daily, Disp: 1 each, Rfl: 0   celecoxib (CELEBREX) 100 MG capsule, Take 1 capsule (100 mg total) by mouth 2 (two) times daily as needed. For foot pain. Start with taking one pill in the morning., Disp: 60 capsule, Rfl: 0   esomeprazole (NEXIUM) 20 MG capsule, Take 20 mg by mouth daily at 12 noon., Disp: , Rfl:    esomeprazole (NEXIUM) 20 MG capsule, Take by mouth., Disp: , Rfl:    hydrochlorothiazide (MICROZIDE) 12.5 MG capsule, Take 1 capsule (12.5 mg total) by mouth daily. For blood pressure, Disp: 90 capsule, Rfl: 1   losartan (COZAAR) 50 MG tablet, Take 1 tablet (50 mg total) by mouth daily., Disp: 90 tablet, Rfl: 1   Multiple Vitamins-Minerals (MENS MULTIVITAMIN PO), Take by mouth., Disp: , Rfl:    triamcinolone cream (KENALOG) 0.1 %, Apply 1 application. topically 2 (two) times daily. On areas that itch, Disp: 45 g, Rfl: 2  Social History   Tobacco Use  Smoking Status Never  Smokeless Tobacco Never    No Known Allergies Objective:  There were no vitals filed for this visit. There is no height or weight on file to calculate BMI. Constitutional Well  developed. Well nourished.  Vascular Dorsalis pedis pulses palpable bilaterally. Posterior tibial pulses palpable bilaterally. Capillary refill normal to all digits.  No cyanosis or clubbing noted. Pedal hair growth normal.  Neurologic Normal speech. Oriented to person, place, and time. Epicritic sensation to light touch grossly present bilaterally.  Dermatologic Nails well groomed and normal in appearance. No open wounds. No skin lesions.  Orthopedic: Normal joint ROM without pain or crepitus bilaterally. No visible deformities. No further tender to palpation at the calcaneal tuber right. No pain with calcaneal squeeze right. Ankle ROM diminished range of motion right. Silfverskiold Test: positive right.   Radiographs: None  Assessment:   No diagnosis found.   Plan:  Patient was evaluated and treated and all questions answered.  Plantar Fasciitis, right -Clinical healed at this time I discussed shoe gear modification and prevention technique.  If any foot and ankle issues arise in the future of asked her to come back and see me.  He states understanding  Pes planovalgus -I explained to patient the etiology of pes planovalgus and relationship with Planter fasciitis and various treatment options were discussed.  Given patient foot structure in the setting of Planter fasciitis I believe patient will benefit from custom-made orthotics to help control the hindfoot motion support the arch of the foot and take the stress away from plantar fascial.  Patient agrees with the  plan like to proceed with orthotics -Patient is to awaiting orthotics     No follow-ups on file.  Dc/ awaitng orthotics send her message

## 2021-10-10 ENCOUNTER — Ambulatory Visit (INDEPENDENT_AMBULATORY_CARE_PROVIDER_SITE_OTHER): Payer: 59 | Admitting: Physician Assistant

## 2021-10-10 ENCOUNTER — Encounter: Payer: Self-pay | Admitting: Physician Assistant

## 2021-10-10 VITALS — BP 128/72 | HR 78 | Ht 73.0 in | Wt 212.4 lb

## 2021-10-10 DIAGNOSIS — Z23 Encounter for immunization: Secondary | ICD-10-CM | POA: Diagnosis not present

## 2021-10-10 DIAGNOSIS — R7303 Prediabetes: Secondary | ICD-10-CM | POA: Diagnosis not present

## 2021-10-10 DIAGNOSIS — M79642 Pain in left hand: Secondary | ICD-10-CM | POA: Diagnosis not present

## 2021-10-10 DIAGNOSIS — E1169 Type 2 diabetes mellitus with other specified complication: Secondary | ICD-10-CM | POA: Insufficient documentation

## 2021-10-10 DIAGNOSIS — M79641 Pain in right hand: Secondary | ICD-10-CM | POA: Diagnosis not present

## 2021-10-10 DIAGNOSIS — E782 Mixed hyperlipidemia: Secondary | ICD-10-CM | POA: Diagnosis not present

## 2021-10-10 DIAGNOSIS — I10 Essential (primary) hypertension: Secondary | ICD-10-CM

## 2021-10-10 NOTE — Assessment & Plan Note (Addendum)
Pt states is arthritis, unsure if ever officially diagnosed Advised to d/c alleve daily and switch to prn tylenol arthritis if needed -- will check cmp as previously elevated LFTs ; if lfts still elevated consider a mobic rx

## 2021-10-10 NOTE — Assessment & Plan Note (Addendum)
Now managing with losartan 100 mg and hctz 12.5 mg  Pt does not check at home Now well controlled. Continue meds. F/u 3 mo

## 2021-10-10 NOTE — Assessment & Plan Note (Addendum)
Check a1c for stability or improvement if stable can check annually

## 2021-10-10 NOTE — Assessment & Plan Note (Signed)
Had started lipitor 20 mg did not recheck -- check fasting lipids

## 2021-10-10 NOTE — Progress Notes (Signed)
I,Sha'taria Tyson,acting as a Education administrator for [employer], PA-C.,have documented all relevant documentation on the behalf of [name], PA-C,as directed by  [name], PA-C while in the presence of [name], PA-C.   Established patient visit   Patient: Carl Crawford   DOB: 1960-05-08   61 y.o. Male  MRN: [id number] Visit Date: 10/10/2021  Today's healthcare provider: [name], PA-C   Cc. Htn f/u  Subjective    HPI   Pt reports arthritis pain in hands, that he takes alleve arthritis daily for aching pain. Also uses topical capsicin cream.  Hypertension, follow-up  BP Readings from Last 3 Encounters:  10/10/21 128/72  08/29/21 (!) 157/78  07/18/21 (!) 156/89   Wt Readings from Last 3 Encounters:  10/10/21 212 lb 6.4 oz (96.3 kg)  08/29/21 211 lb 6.4 oz (95.9 kg)  07/18/21 208 lb 1.6 oz (94.4 kg)     He was last seen for hypertension 6 weeks ago.  BP at that visit was 157/78. Management since that visit includes increasing to losartan 100 mg and continue hctz 12.5 mg. .  He reports good compliance with treatment. He is not having side effects. He is following a Regular diet. He is not exercising. He does not smoke.  Use of agents associated with hypertension: none.   Outside blood pressures are not being checked' Symptoms: No chest pain No chest pressure  No palpitations No syncope  No dyspnea No orthopnea  No paroxysmal nocturnal dyspnea No lower extremity edema   Pertinent labs Lab Results  Component Value Date   CHOL 248 (H) 05/10/2021   HDL 45 05/10/2021   LDLCALC 186 (H) 05/10/2021   TRIG 97 05/10/2021   CHOLHDL 5.5 (H) 05/10/2021   Lab Results  Component Value Date   NA 143 05/10/2021   K 5.0 05/10/2021   CREATININE 0.82 05/10/2021   EGFR 99 05/10/2021   GLUCOSE 125 (H) 05/10/2021     The 10-year ASCVD risk score (Arnett DK, et al., 2019) is:  16%  ---------------------------------------------------------------------------------------------------   Medications: Outpatient Medications Prior to Visit  Medication Sig   atorvastatin (LIPITOR) 20 MG tablet Take 1 tablet (20 mg total) by mouth daily.   Blood Pressure Monitoring (BLOOD PRESSURE KIT) DEVI Use to check blood pressure at home daily   esomeprazole (NEXIUM) 20 MG capsule Take 20 mg by mouth daily at 12 noon.   esomeprazole (NEXIUM) 20 MG capsule Take by mouth.   hydrochlorothiazide (MICROZIDE) 12.5 MG capsule Take 1 capsule (12.5 mg total) by mouth daily. For blood pressure   losartan (COZAAR) 50 MG tablet Take 1 tablet (50 mg total) by mouth daily.   Multiple Vitamins-Minerals (MENS MULTIVITAMIN PO) Take by mouth.   triamcinolone cream (KENALOG) 0.1 % Apply 1 application. topically 2 (two) times daily. On areas that itch   [DISCONTINUED] celecoxib (CELEBREX) 100 MG capsule Take 1 capsule (100 mg total) by mouth 2 (two) times daily as needed. For foot pain. Start with taking one pill in the morning.   No facility-administered medications prior to visit.    Review of Systems  Constitutional:  Negative for fatigue and fever.  Respiratory:  Negative for cough and shortness of breath.   Cardiovascular:  Negative for chest pain, palpitations and leg swelling.  Neurological:  Negative for dizziness and headaches.      Objective    Blood pressure 128/72, pulse 78, height _0  (1.854 m), weight 212 lb 6.4 oz (96.3 kg), SpO2 98 %.  Physical Exam Constitutional:      General: He is awake.     Appearance: He is well-developed.  HENT:     Head: Normocephalic.  Eyes:     Conjunctiva/sclera: Conjunctivae normal.  Cardiovascular:     Rate and Rhythm: Normal rate and regular rhythm.     Heart sounds: Normal heart sounds.  Pulmonary:     Effort: Pulmonary effort is normal.     Breath sounds: Normal breath sounds.  Musculoskeletal:     Right lower leg: No edema.      Left lower leg: No edema.  Skin:    General: Skin is warm.  Neurological:     Mental Status: He is alert and oriented to person, place, and time.  Psychiatric:        Attention and Perception: Attention normal.        Mood and Affect: Mood normal.        Speech: Speech normal.        Behavior: Behavior is cooperative.      No results found for any visits on 10/10/21.  Assessment & Plan     Problem List Items Addressed This Visit       Cardiovascular and Mediastinum   Primary hypertension - Primary    Now managing with losartan 100 mg and hctz 12.5 mg  Pt does not check at home Now well controlled. Continue meds. F/u 3 mo       Relevant Orders   Comprehensive Metabolic Panel (CMET)     Other   Mixed hyperlipidemia    Had started lipitor 20 mg did not recheck -- check fasting lipids       Relevant Orders   Lipid Profile   Prediabetes    Check a1c for stability or improvement if stable can check annually       Relevant Orders   HgB A1c   Pain in both hands    Pt states is arthritis, unsure if ever officially diagnosed Advised to d/c alleve daily and switch to prn tylenol arthritis if needed -- will check cmp as previously elevated LFTs ; if lfts still elevated consider a mobic rx      Other Visit Diagnoses     Need for immunization against influenza       Relevant Orders   Flu Vaccine QUAD 83moIM (Fluarix, Fluzone & Alfiuria Quad PF) (Completed)        Return in about 3 months (around 01/09/2022) for hypertension.      I, LMikey Kirschner PA-C have reviewed all documentation for this visit. The documentation on  10/10/2021  for the exam, diagnosis, procedures, and orders are all accurate and complete.  LMikey Kirschner PA-C [facility] [place] [address]Office: [phone]: [phone]

## 2021-10-16 DIAGNOSIS — E782 Mixed hyperlipidemia: Secondary | ICD-10-CM | POA: Diagnosis not present

## 2021-10-16 DIAGNOSIS — I10 Essential (primary) hypertension: Secondary | ICD-10-CM | POA: Diagnosis not present

## 2021-10-17 ENCOUNTER — Other Ambulatory Visit: Payer: Self-pay | Admitting: Physician Assistant

## 2021-10-17 DIAGNOSIS — R748 Abnormal levels of other serum enzymes: Secondary | ICD-10-CM

## 2021-10-17 LAB — HEMOGLOBIN A1C
Est. average glucose Bld gHb Est-mCnc: 143 mg/dL
Hgb A1c MFr Bld: 6.6 % — ABNORMAL HIGH (ref 4.8–5.6)

## 2021-10-17 LAB — LIPID PANEL
Chol/HDL Ratio: 3.5 ratio (ref 0.0–5.0)
Cholesterol, Total: 142 mg/dL (ref 100–199)
HDL: 41 mg/dL (ref >39)
LDL Chol Calc (NIH): 88 mg/dL (ref 0–99)
Triglycerides: 62 mg/dL (ref 0–149)
VLDL Cholesterol Cal: 13 mg/dL (ref 5–40)

## 2021-10-17 LAB — COMPREHENSIVE METABOLIC PANEL
ALT: 71 IU/L — ABNORMAL HIGH (ref 0–44)
AST: 41 IU/L — ABNORMAL HIGH (ref 0–40)
Albumin/Globulin Ratio: 1.8 (ref 1.2–2.2)
Albumin: 4.4 g/dL (ref 3.9–4.9)
Alkaline Phosphatase: 82 IU/L (ref 44–121)
BUN/Creatinine Ratio: 11 (ref 10–24)
BUN: 9 mg/dL (ref 8–27)
Bilirubin Total: 0.4 mg/dL (ref 0.0–1.2)
CO2: 22 mmol/L (ref 20–29)
Calcium: 9.3 mg/dL (ref 8.6–10.2)
Chloride: 101 mmol/L (ref 96–106)
Creatinine, Ser: 0.83 mg/dL (ref 0.76–1.27)
Globulin, Total: 2.5 g/dL (ref 1.5–4.5)
Glucose: 158 mg/dL — ABNORMAL HIGH (ref 70–99)
Potassium: 4.4 mmol/L (ref 3.5–5.2)
Sodium: 138 mmol/L (ref 134–144)
Total Protein: 6.9 g/dL (ref 6.0–8.5)
eGFR: 98 mL/min/{1.73_m2} (ref >59)

## 2021-10-30 ENCOUNTER — Encounter: Payer: Self-pay | Admitting: Family Medicine

## 2021-10-30 ENCOUNTER — Ambulatory Visit: Payer: 59 | Admitting: Family Medicine

## 2021-10-30 VITALS — BP 168/89 | HR 78 | Temp 98.6°F | Resp 16 | Ht 73.0 in | Wt 212.2 lb

## 2021-10-30 DIAGNOSIS — J069 Acute upper respiratory infection, unspecified: Secondary | ICD-10-CM

## 2021-10-30 NOTE — Assessment & Plan Note (Signed)
This is an acute problem Overall symptoms are improving per patient We will submit send out test for COVID Counseled patient that these results should be available in the next 24-48 hours It was as increasing fluid intake to prevent dehydration as well as drinking warm teas with honey to help with cough Counseled patient that if he become short of breath, lightheaded or dizzy that he should be evaluated urgently Patient to follow-up as needed if symptoms persist or worsen

## 2021-10-30 NOTE — Progress Notes (Signed)
Established patient visit  I,Carl Crawford,acting as a scribe for [employer], MD.,have documented all relevant documentation on the behalf of Carl Foster, MD,as directed by  Carl Foster, MD while in the presence of Carl Foster, MD.   Patient: Carl Crawford   DOB: 25-Mar-1960   61 y.o. Male  MRN: [id number] Visit Date: 10/30/2021  Today's healthcare provider: [name], MD   Chief Complaint  Patient presents with   URI   Subjective    Patient reports his symptoms have been present for 3 days He states that last night he drank Alka-Seltzer and his symptoms are improved today He states that he has experienced fevers and chills both have improved He denies nausea, vomiting, diarrhea He does endorse myalgias Patient states that he had his influenza vaccine on 10/10/2021 and thinks that his symptoms may be related to this He denies any headache, sneezing, ear pain He states that he has a mild sore throat Patient reports that he lives alone with his cat Patient denies feeling short of breath but does state day when he coughs he has been epigastric discomfort that immediately goes away once he stops coughing    HPI     URI   Associated symptoms inlclude cough, ear pain and sore throat (Watery eyes and slight fever).  Recent episode started in the past 7 days (Saturday night to Sunday morning. ).  The problem has been unchanged since onset.  Patient  is drinking moderate amounts of fluids.      Last edited by Doristine Devoid, CMA on 10/30/2021  3:30 PM.      Medications: Outpatient Medications Prior to Visit  Medication Sig   atorvastatin (LIPITOR) 20 MG tablet Take 1 tablet (20 mg total) by mouth daily.   Blood Pressure Monitoring (BLOOD PRESSURE KIT) DEVI Use to check blood pressure at home daily   esomeprazole (NEXIUM) 20 MG capsule Take 20 mg by mouth daily at 12 noon.   esomeprazole (NEXIUM) 20 MG  capsule Take by mouth.   hydrochlorothiazide (MICROZIDE) 12.5 MG capsule Take 1 capsule (12.5 mg total) by mouth daily. For blood pressure   losartan (COZAAR) 50 MG tablet Take 1 tablet (50 mg total) by mouth daily.   Multiple Vitamins-Minerals (MENS MULTIVITAMIN PO) Take by mouth.   triamcinolone cream (KENALOG) 0.1 % Apply 1 application. topically 2 (two) times daily. On areas that itch   No facility-administered medications prior to visit.    Review of Systems     Objective    BP (!) 168/89 (BP Location: Left Arm, Patient Position: Sitting, Cuff Size: Large)   Pulse 78   Temp 98.6 F (37 C) (Oral)   Resp 16   Ht $R'6\' 1"'gg$  (1.854 m)   Wt 212 lb 3.2 oz (96.3 kg)   BMI 28.00 kg/m    Physical Exam HENT:     Right Ear: No drainage or tenderness. A middle ear effusion is present. Tympanic membrane is not perforated, erythematous or bulging.     Left Ear: No drainage or tenderness.  No middle ear effusion. Tympanic membrane is not perforated, erythematous or bulging.     Mouth/Throat:     Pharynx: Posterior oropharyngeal erythema present. No oropharyngeal exudate.  Pulmonary:     Effort: Pulmonary effort is normal. No respiratory distress.     Breath sounds: Normal breath sounds. No stridor. No wheezing, rhonchi or rales.      No results found for any visits on  10/30/21.  Assessment & Plan     Problem List Items Addressed This Visit       Respiratory   Upper respiratory tract infection - Primary    This is an acute problem Overall symptoms are improving per patient We will submit send out test for COVID Counseled patient that these results should be available in the next 24-48 hours It was as increasing fluid intake to prevent dehydration as well as drinking warm teas with honey to help with cough Counseled patient that if he become short of breath, lightheaded or dizzy that he should be evaluated urgently Patient to follow-up as needed if symptoms persist or worsen       Relevant Orders   COVID-19, Flu A+B and RSV     Return if symptoms worsen or fail to improve.      Cain Sieve Simmons-Robinson MD, have reviewed all documentation for this visit. The documentation for the exam, diagnosis, procedures, and orders are all accurate and complete.    Carl Foster, MD  [facility] [phone] (phone) [phone] (fax)  [place]

## 2021-10-31 LAB — COVID-19, FLU A+B AND RSV
Influenza A, NAA: NOT DETECTED
Influenza B, NAA: NOT DETECTED
RSV, NAA: NOT DETECTED
SARS-CoV-2, NAA: DETECTED — AB

## 2021-10-31 LAB — SPECIMEN STATUS REPORT

## 2021-11-01 ENCOUNTER — Ambulatory Visit: Payer: Self-pay

## 2021-11-01 NOTE — Telephone Encounter (Signed)
Noted ty

## 2021-11-01 NOTE — Telephone Encounter (Signed)
Summary: Covid test results   Pt requesting call back for Covid test results. Cb# [phone]       Called pt  - [id number]

## 2021-11-01 NOTE — Telephone Encounter (Signed)
  Chief Complaint: COVID + Symptoms: Feeling better Frequency:  Pertinent Negatives: Patient denies Fever Disposition: _0 ED /_1 Urgent Care (no appt availability in office) / _2 Appointment(In office/virtual)/ _3  [facility]/ _4 Home Care/ _5 Refused Recommended Disposition /_6 [facility]/ _7  Follow-up with PCP Additional Notes: Called pt  - gave positive test result to pt. Pt states he is feeling much better. He is currently taking IBU. He states that congestion and other s/s have improved. PT will continue to care for himself at home.  Summary: Covid test results   Pt requesting call back for Covid test results. Cb# [phone]      Reason for Disposition  [1] RTQSY-[id number] diagnosed by positive lab test (e.g., PCR, rapid self-test kit) AND [2] mild symptoms (e.g., cough, fever, others) AND [9] no complications or SOB  Answer Assessment - Initial Assessment Questions 1. COVID-19 DIAGNOSIS: "How do you know that you have COVID?" (e.g., positive lab test or self-test, diagnosed by doctor or NP/PA, symptoms after exposure).     yes 2. COVID-19 EXPOSURE: "Was there any known exposure to COVID before the symptoms began?" CDC Definition of close contact: within 6 feet (2 meters) for a total of 15 minutes or more over a 24-hour period.       3. ONSET: "When did the COVID-19 symptoms start?"       4. WORST SYMPTOM: "What is your worst symptom?" (e.g., cough, fever, shortness of breath, muscle aches)      5. COUGH: "Do you have a cough?" If Yes, ask: "How bad is the cough?"        6. FEVER: "Do you have a fever?" If Yes, ask: "What is your temperature, how was it measured, and when did it start?"     no 7. RESPIRATORY STATUS: "Describe your breathing?" (e.g., normal; shortness of breath, wheezing, unable to speak)       8. BETTER-SAME-WORSE: "Are you getting better, staying the same or getting worse compared to yesterday?"  If getting worse, ask, "In what way?"     better 9.  OTHER SYMPTOMS: "Do you have any other symptoms?"  (e.g., chills, fatigue, headache, loss of smell or taste, muscle pain, sore throat)     Stuffy head , cough 10. HIGH RISK DISEASE: "Do you have any chronic medical problems?" (e.g., asthma, heart or lung disease, weak immune system, obesity, etc.)        11. VACCINE: "Have you had the COVID-19 vaccine?" If Yes, ask: "Which one, how many shots, when did you get it?"        12. PREGNANCY: "Is there any chance you are pregnant?" "When was your last menstrual period?"        13. O2 SATURATION MONITOR:  "Do you use an oxygen saturation monitor (pulse oximeter) at home?" If Yes, ask "What is your reading (oxygen level) today?" "What is your usual oxygen saturation reading?" (e.g., 95%)  Protocols used: Coronavirus (COVID-19) Diagnosed or Suspected-A-AH

## 2021-11-12 ENCOUNTER — Ambulatory Visit: Payer: 59 | Admitting: Dermatology

## 2021-11-12 DIAGNOSIS — L821 Other seborrheic keratosis: Secondary | ICD-10-CM

## 2021-11-12 DIAGNOSIS — L820 Inflamed seborrheic keratosis: Secondary | ICD-10-CM | POA: Diagnosis not present

## 2021-11-12 DIAGNOSIS — L578 Other skin changes due to chronic exposure to nonionizing radiation: Secondary | ICD-10-CM

## 2021-11-12 DIAGNOSIS — L918 Other hypertrophic disorders of the skin: Secondary | ICD-10-CM | POA: Diagnosis not present

## 2021-11-12 NOTE — Patient Instructions (Signed)
Cryotherapy Aftercare  Wash gently with soap and water everyday.   Apply Vaseline and Band-Aid daily until healed.    Wound Care Instructions  Cleanse wound gently with soap and water once a day then pat dry with clean gauze. Apply a thin coat of Petrolatum (petroleum jelly, "Vaseline") over the wound (unless you have an allergy to this). We recommend that you use a new, sterile tube of Vaseline. Do not pick or remove scabs. Do not remove the yellow or white "healing tissue" from the base of the wound.  Cover the wound with fresh, clean, nonstick gauze and secure with paper tape. You may use Band-Aids in place of gauze and tape if the wound is small enough, but would recommend trimming much of the tape off as there is often too much. Sometimes Band-Aids can irritate the skin.  You should call the office for your biopsy report after 1 week if you have not already been contacted.  If you experience any problems, such as abnormal amounts of bleeding, swelling, significant bruising, significant pain, or evidence of infection, please call the office immediately.  FOR ADULT SURGERY PATIENTS: If you need something for pain relief you may take 1 extra strength Tylenol (acetaminophen) AND 2 Ibuprofen (200mg each) together every 4 hours as needed for pain. (do not take these if you are allergic to them or if you have a reason you should not take them.) Typically, you may only need pain medication for 1 to 3 days.    

## 2021-11-12 NOTE — Progress Notes (Signed)
   New Patient Visit  Subjective  Carl Crawford is a 61 y.o. male who presents for the following: Other (Skin tags - neck ,axilla). The patient has spots, moles and lesions to be evaluated, some may be new or changing and the patient has concerns that these could be cancer.  The following portions of the chart were reviewed this encounter and updated as appropriate:   Tobacco  Allergies  Meds  Problems  Med Hx  Surg Hx  Fam Hx     Review of Systems:  No other skin or systemic complaints except as noted in HPI or Assessment and Plan.  Objective  Well appearing patient in no apparent distress; mood and affect are within normal limits.  A focused examination was performed including face, neck. Relevant physical exam findings are noted in the Assessment and Plan.  Neck x 20, left cheek x 3 (23) Erythematous stuck-on, waxy papule or plaque             Assessment & Plan   Actinic Damage - chronic, secondary to cumulative UV radiation exposure/sun exposure over time - diffuse scaly erythematous macules with underlying dyspigmentation - Recommend daily broad spectrum sunscreen SPF 30+ to sun-exposed areas, reapply every 2 hours as needed.  - Recommend staying in the shade or wearing long sleeves, sun glasses (UVA+UVB protection) and wide brim hats (4-inch brim around the entire circumference of the hat). - Call for new or changing lesions.  Seborrheic Keratoses - Stuck-on, waxy, tan-brown papules and/or plaques  - Benign-appearing - Discussed benign etiology and prognosis. - Observe - Call for any changes  Acrochordons (Skin Tags) - Fleshy, skin-colored pedunculated papules - Benign appearing.  - Observe. - If desired, they can be removed with an in office procedure that is not covered by insurance. - Please call the clinic if you notice any new or changing lesions.  Inflamed seborrheic keratosis (23) Neck x 20, left cheek x 3  Destruction of lesion - Neck x 20,  left cheek x 3 Complexity: simple   Destruction method: cryotherapy   Informed consent: discussed and consent obtained   Timeout:  patient name, date of birth, surgical site, and procedure verified Lesion destroyed using liquid nitrogen: Yes   Region frozen until ice ball extended beyond lesion: Yes   Outcome: patient tolerated procedure well with no complications   Post-procedure details: wound care instructions given     Return in about 3 months (around 02/12/2022).  I, Ashok Cordia, CMA, am acting as scribe for Sarina Ser, MD . Documentation: I have reviewed the above documentation for accuracy and completeness, and I agree with the above.  Sarina Ser, MD

## 2021-11-23 ENCOUNTER — Encounter: Payer: Self-pay | Admitting: Dermatology

## 2021-12-08 ENCOUNTER — Other Ambulatory Visit: Payer: Self-pay | Admitting: Physician Assistant

## 2021-12-08 DIAGNOSIS — I10 Essential (primary) hypertension: Secondary | ICD-10-CM

## 2022-01-09 ENCOUNTER — Encounter: Payer: Self-pay | Admitting: Physician Assistant

## 2022-01-09 ENCOUNTER — Ambulatory Visit (INDEPENDENT_AMBULATORY_CARE_PROVIDER_SITE_OTHER): Payer: 59 | Admitting: Physician Assistant

## 2022-01-09 VITALS — BP 136/88 | HR 71 | Temp 97.5°F | Wt 215.9 lb

## 2022-01-09 DIAGNOSIS — I10 Essential (primary) hypertension: Secondary | ICD-10-CM

## 2022-01-09 NOTE — Assessment & Plan Note (Addendum)
Chronic,  well controlled Managed with losartan 100 mg and hctz 12.5 mg  Follow up 6 mo ; encouraged pt to bring all medications to visit

## 2022-01-09 NOTE — Progress Notes (Signed)
I,Connie R Striblin,acting as a Education administrator for [employer], PA-C.,have documented all relevant documentation on the behalf of [name], PA-C,as directed by  [name], PA-C while in the presence of [name], PA-C.   Established patient visit   Patient: Carl Crawford   DOB: 05-09-1960   61 y.o. Male  MRN: [id number] Visit Date: 01/09/2022  Today's healthcare provider: [name], PA-C   Chief Complaint  Patient presents with   Follow-up   Subjective    HPI  Hypertension, follow-up  BP Readings from Last 3 Encounters:  01/09/22 136/88  10/30/21 (!) 168/89  10/10/21 128/72   Wt Readings from Last 3 Encounters:  01/09/22 215 lb 14.4 oz (97.9 kg)  10/30/21 212 lb 3.2 oz (96.3 kg)  10/10/21 212 lb 6.4 oz (96.3 kg)     He was last seen for hypertension 3 months ago.  BP at that visit was 168/89. Management since that visit includes monitoring at home .  He reports excellent compliance with treatment. He is not having side effects.  He is following a Low Sodium diet. He is not exercising. He does not smoke.  Use of agents associated with hypertension: none.   Outside blood pressures are not being taken . Symptoms: No chest pain No chest pressure  No palpitations No syncope  No dyspnea No orthopnea  No paroxysmal nocturnal dyspnea No lower extremity edema   Pertinent labs Lab Results  Component Value Date   CHOL 142 10/16/2021   HDL 41 10/16/2021   LDLCALC 88 10/16/2021   TRIG 62 10/16/2021   CHOLHDL 3.5 10/16/2021   Lab Results  Component Value Date   NA 138 10/16/2021   K 4.4 10/16/2021   CREATININE 0.83 10/16/2021   EGFR 98 10/16/2021   GLUCOSE 158 (H) 10/16/2021     The 10-year ASCVD risk score (Arnett DK, et al., 2019) is: 12.3%  ---------------------------------------------------------------------------------------------------   Medications: Outpatient Medications Prior to Visit  Medication Sig   atorvastatin (LIPITOR) 20  MG tablet Take 1 tablet (20 mg total) by mouth daily.   Blood Pressure Monitoring (BLOOD PRESSURE KIT) DEVI Use to check blood pressure at home daily   esomeprazole (NEXIUM) 20 MG capsule Take 20 mg by mouth daily at 12 noon.   hydrochlorothiazide (MICROZIDE) 12.5 MG capsule Take 1 capsule (12.5 mg total) by mouth daily. For blood pressure   losartan (COZAAR) 50 MG tablet TAKE 1 TABLET BY MOUTH EVERY DAY   Multiple Vitamins-Minerals (MENS MULTIVITAMIN PO) Take by mouth.   triamcinolone cream (KENALOG) 0.1 % Apply 1 application. topically 2 (two) times daily. On areas that itch   [DISCONTINUED] esomeprazole (NEXIUM) 20 MG capsule Take by mouth.   No facility-administered medications prior to visit.    Review of Systems  Constitutional:  Negative for fatigue and fever.  Respiratory:  Negative for cough and shortness of breath.   Cardiovascular:  Negative for chest pain, palpitations and leg swelling.  Neurological:  Negative for dizziness and headaches.      Objective    Blood pressure 136/88, pulse 71, temperature (!) 97.5 F (36.4 C), temperature source Oral, weight 215 lb 14.4 oz (97.9 kg), SpO2 98 %.   Physical Exam Constitutional:      General: He is awake.     Appearance: He is well-developed.  HENT:     Head: Normocephalic.  Eyes:     Conjunctiva/sclera: Conjunctivae normal.  Cardiovascular:     Rate and Rhythm: Normal rate and  regular rhythm.     Heart sounds: Normal heart sounds.  Pulmonary:     Effort: Pulmonary effort is normal.  Skin:    General: Skin is warm.  Neurological:     Mental Status: He is alert and oriented to person, place, and time.  Psychiatric:        Attention and Perception: Attention normal.        Mood and Affect: Mood normal.        Speech: Speech normal.        Behavior: Behavior is cooperative.      No results found for any visits on 01/09/22.  Assessment & Plan     Problem List Items Addressed This Visit       Cardiovascular and  Mediastinum   Primary hypertension - Primary    Chronic,  well controlled Managed with losartan 100 mg and hctz 12.5 mg  Follow up 6 mo ; encouraged pt to bring all medications to visit        Return if symptoms worsen or fail to improve.      I, [name], PA-C have reviewed all documentation for this visit. The documentation on  01/09/2022  for the exam, diagnosis, procedures, and orders are all accurate and complete.  [name], PA-C [facility] [address] [place], [place], [address] Office: [phone] Fax: [place]

## 2022-01-22 ENCOUNTER — Telehealth: Payer: Self-pay | Admitting: Podiatry

## 2022-01-22 NOTE — Telephone Encounter (Signed)
Left message on vm for pt to schedule appt to pick up orthoticsk -   Balance is 438.00

## 2022-01-30 ENCOUNTER — Other Ambulatory Visit: Payer: Self-pay | Admitting: Physician Assistant

## 2022-01-30 DIAGNOSIS — I10 Essential (primary) hypertension: Secondary | ICD-10-CM

## 2022-02-05 NOTE — Telephone Encounter (Signed)
Left message on vm for pt to schedule appt to pick up orthoticsk -    Balance is 438.00

## 2022-02-13 ENCOUNTER — Ambulatory Visit: Payer: 59 | Admitting: Dermatology

## 2022-02-13 DIAGNOSIS — L578 Other skin changes due to chronic exposure to nonionizing radiation: Secondary | ICD-10-CM

## 2022-02-13 DIAGNOSIS — L820 Inflamed seborrheic keratosis: Secondary | ICD-10-CM | POA: Diagnosis not present

## 2022-02-13 DIAGNOSIS — L821 Other seborrheic keratosis: Secondary | ICD-10-CM

## 2022-02-13 NOTE — Patient Instructions (Signed)
Cryotherapy Aftercare  Wash gently with soap and water everyday.   Apply Vaseline and Band-Aid daily until healed.     Due to recent changes in healthcare laws, you may see results of your pathology and/or laboratory studies on MyChart before the doctors have had a chance to review them. We understand that in some cases there may be results that are confusing or concerning to you. Please understand that not all results are received at the same time and often the doctors may need to interpret multiple results in order to provide you with the best plan of care or course of treatment. Therefore, we ask that you please give us 2 business days to thoroughly review all your results before contacting the office for clarification. Should we see a critical lab result, you will be contacted sooner.   If You Need Anything After Your Visit  If you have any questions or concerns for your doctor, please call our main line at [phone] and press option 4 to reach your doctor's medical assistant. If no one answers, please leave a voicemail as directed and we will return your call as soon as possible. Messages left after 4 pm will be answered the following business day.   You may also send us a message via MyChart. We typically respond to MyChart messages within 1-2 business days.  For prescription refills, please ask your pharmacy to contact our office. Our fax number is [phone].  If you have an urgent issue when the clinic is closed that cannot wait until the next business day, you can page your doctor at the number below.    Please note that while we do our best to be available for urgent issues outside of office hours, we are not available 24/7.   If you have an urgent issue and are unable to reach us, you may choose to seek medical care at your doctor's office, retail clinic, urgent care center, or emergency room.  If you have a medical emergency, please immediately call 911 or go to the  emergency department.  Pager Numbers  - Dr. Kowalski: [phone]  - Dr. Moye: [phone]  - Dr. Stewart: [phone]  In the event of inclement weather, please call our main line at [phone] for an update on the status of any delays or closures.  Dermatology Medication Tips: Please keep the boxes that topical medications come in in order to help keep track of the instructions about where and how to use these. Pharmacies typically print the medication instructions only on the boxes and not directly on the medication tubes.   If your medication is too expensive, please contact our office at [phone] option 4 or send us a message through MyChart.   We are unable to tell what your co-pay for medications will be in advance as this is different depending on your insurance coverage. However, we may be able to find a substitute medication at lower cost or fill out paperwork to get insurance to cover a needed medication.   If a prior authorization is required to get your medication covered by your insurance company, please allow us 1-2 business days to complete this process.  Drug prices often vary depending on where the prescription is filled and some pharmacies may offer cheaper prices.  The website www.goodrx.com contains coupons for medications through different pharmacies. The prices here do not account for what the cost may be with help from insurance (it may be cheaper with your insurance), but the website can   give you the price if you did not use any insurance.  - You can print the associated coupon and take it with your prescription to the pharmacy.  - You may also stop by our office during regular business hours and pick up a GoodRx coupon card.  - If you need your prescription sent electronically to a different pharmacy, notify our office through [facility] MyChart or by phone at [phone] option 4.     Si Usted Necesita Algo Despus de Su Visita  Tambin puede  enviarnos un mensaje a travs de MyChart. Por lo general respondemos a los mensajes de MyChart en el transcurso de 1 a 2 das hbiles.  Para renovar recetas, por favor pida a su farmacia que se ponga en contacto con nuestra oficina. Nuestro nmero de fax es el [phone].  Si tiene un asunto urgente cuando la clnica est cerrada y que no puede esperar hasta el siguiente da hbil, puede llamar/localizar a su doctor(a) al nmero que aparece a continuacin.   Por favor, tenga en cuenta que aunque hacemos todo lo posible para estar disponibles para asuntos urgentes fuera del horario de oficina, no estamos disponibles las 24 horas del da, los 7 das de la semana.   Si tiene un problema urgente y no puede comunicarse con nosotros, puede optar por buscar atencin mdica  en el consultorio de su doctor(a), en una clnica privada, en un centro de atencin urgente o en una sala de emergencias.  Si tiene una emergencia mdica, por favor llame inmediatamente al 911 o vaya a la sala de emergencias.  Nmeros de bper  - Dr. Kowalski: [phone]  - Dra. Moye: [phone]  - Dra. Stewart: [phone]  En caso de inclemencias del tiempo, por favor llame a nuestra lnea principal al [phone] para una actualizacin sobre el estado de cualquier retraso o cierre.  Consejos para la medicacin en dermatologa: Por favor, guarde las cajas en las que vienen los medicamentos de uso tpico para ayudarle a seguir las instrucciones sobre dnde y cmo usarlos. Las farmacias generalmente imprimen las instrucciones del medicamento slo en las cajas y no directamente en los tubos del medicamento.   Si su medicamento es muy caro, por favor, pngase en contacto con nuestra oficina llamando al [phone] y presione la opcin 4 o envenos un mensaje a travs de MyChart.   No podemos decirle cul ser su copago por los medicamentos por adelantado ya que esto es diferente dependiendo de la cobertura de su seguro.  Sin embargo, es posible que podamos encontrar un medicamento sustituto a menor costo o llenar un formulario para que el seguro cubra el medicamento que se considera necesario.   Si se requiere una autorizacin previa para que su compaa de seguros cubra su medicamento, por favor permtanos de 1 a 2 das hbiles para completar este proceso.  Los precios de los medicamentos varan con frecuencia dependiendo del lugar de dnde se surte la receta y alguna farmacias pueden ofrecer precios ms baratos.  El sitio web www.goodrx.com tiene cupones para medicamentos de diferentes farmacias. Los precios aqu no tienen en cuenta lo que podra costar con la ayuda del seguro (puede ser ms barato con su seguro), pero el sitio web puede darle el precio si no utiliz ningn seguro.  - Puede imprimir el cupn correspondiente y llevarlo con su receta a la farmacia.  - Tambin puede pasar por nuestra oficina durante el horario de atencin regular y recoger una tarjeta de cupones de GoodRx.  -   Si necesita que su receta se enve electrnicamente a una farmacia diferente, informe a nuestra oficina a travs de MyChart de [place] o por telfono llamando al [phone] y presione la opcin 4.  

## 2022-02-13 NOTE — Progress Notes (Signed)
   Follow-Up Visit   Subjective  Carl Crawford is a 62 y.o. male who presents for the following: Follow-up (Inflamed SK follow up neck treated with LN2). The patient has spots, moles and lesions to be evaluated, some may be new or changing and the patient has concerns that these could be cancer.  The following portions of the chart were reviewed this encounter and updated as appropriate:   Tobacco  Allergies  Meds  Problems  Med Hx  Surg Hx  Fam Hx     Review of Systems:  No other skin or systemic complaints except as noted in HPI or Assessment and Plan.  Objective  Well appearing patient in no apparent distress; mood and affect are within normal limits.  A focused examination was performed including face, neck. Relevant physical exam findings are noted in the Assessment and Plan.  Left neck x 22, left cheek x 1 (23) Erythematous stuck-on, waxy papule or plaques of neck. 1.2 x 1.0 cm keratotic papule of left cheek.   Assessment & Plan   Actinic Damage - chronic, secondary to cumulative UV radiation exposure/sun exposure over time - diffuse scaly erythematous macules with underlying dyspigmentation - Recommend daily broad spectrum sunscreen SPF 30+ to sun-exposed areas, reapply every 2 hours as needed.  - Recommend staying in the shade or wearing long sleeves, sun glasses (UVA+UVB protection) and wide brim hats (4-inch brim around the entire circumference of the hat). - Call for new or changing lesions.  Seborrheic Keratoses - Stuck-on, waxy, tan-brown papules and/or plaques  - Benign-appearing - Discussed benign etiology and prognosis. - Observe - Call for any changes  Inflamed seborrheic keratosis (23) Left neck x 22, left cheek x 1  May consider shave removal to left cheek if persistent.  Destruction of lesion - Left neck x 22, left cheek x 1 Complexity: simple   Destruction method: cryotherapy   Informed consent: discussed and consent obtained   Timeout:   patient name, date of birth, surgical site, and procedure verified Lesion destroyed using liquid nitrogen: Yes   Region frozen until ice ball extended beyond lesion: Yes   Outcome: patient tolerated procedure well with no complications   Post-procedure details: wound care instructions given     Return in about 3 months (around 05/15/2022).  I, Ashok Cordia, CMA, am acting as scribe for Sarina Ser, MD . Documentation: I have reviewed the above documentation for accuracy and completeness, and I agree with the above.  Sarina Ser, MD

## 2022-02-14 ENCOUNTER — Ambulatory Visit: Payer: 59 | Admitting: Dermatology

## 2022-02-26 ENCOUNTER — Encounter: Payer: Self-pay | Admitting: Dermatology

## 2022-05-08 ENCOUNTER — Ambulatory Visit: Payer: 59 | Admitting: Dermatology

## 2022-05-08 VITALS — BP 132/82 | HR 77

## 2022-05-08 DIAGNOSIS — B079 Viral wart, unspecified: Secondary | ICD-10-CM | POA: Diagnosis not present

## 2022-05-08 DIAGNOSIS — L821 Other seborrheic keratosis: Secondary | ICD-10-CM

## 2022-05-08 DIAGNOSIS — L578 Other skin changes due to chronic exposure to nonionizing radiation: Secondary | ICD-10-CM

## 2022-05-08 DIAGNOSIS — D489 Neoplasm of uncertain behavior, unspecified: Secondary | ICD-10-CM

## 2022-05-08 DIAGNOSIS — L820 Inflamed seborrheic keratosis: Secondary | ICD-10-CM | POA: Diagnosis not present

## 2022-05-08 NOTE — Patient Instructions (Addendum)
Biopsy Wound Care Instructions  Leave the original bandage on for 24 hours if possible.  If the bandage becomes soaked or soiled before that time, it is OK to remove it and examine the wound.  A small amount of post-operative bleeding is normal.  If excessive bleeding occurs, remove the bandage, place gauze over the site and apply continuous pressure (no peeking) over the area for 30 minutes. If this does not work, please call our clinic as soon as possible or page your doctor if it is after hours.   Once a day, cleanse the wound with soap and water. It is fine to shower. If a thick crust develops you may use a Q-tip dipped into dilute hydrogen peroxide (mix 1:1 with water) to dissolve it.  Hydrogen peroxide can slow the healing process, so use it only as needed.    After washing, apply petroleum jelly (Vaseline) or an antibiotic ointment if your doctor prescribed one for you, followed by a bandage.    For best healing, the wound should be covered with a layer of ointment at all times. If you are not able to keep the area covered with a bandage to hold the ointment in place, this may mean re-applying the ointment several times a day.  Continue this wound care until the wound has healed and is no longer open.   Itching and mild discomfort is normal during the healing process. However, if you develop pain or severe itching, please call our office.   If you have any discomfort, you can take Tylenol (acetaminophen) or ibuprofen as directed on the bottle. (Please do not take these if you have an allergy to them or cannot take them for another reason).  Some redness, tenderness and white or yellow material in the wound is normal healing.  If the area becomes very sore and red, or develops a thick yellow-green material (pus), it may be infected; please notify us.    If you have stitches, return to clinic as directed to have the stitches removed. You will continue wound care for 2-3 days after the stitches  are removed.   Wound healing continues for up to one year following surgery. It is not unusual to experience pain in the scar from time to time during the interval.  If the pain becomes severe or the scar thickens, you should notify the office.    A slight amount of redness in a scar is expected for the first six months.  After six months, the redness will fade and the scar will soften and fade.  The color difference becomes less noticeable with time.  If there are any problems, return for a post-op surgery check at your earliest convenience.  To improve the appearance of the scar, you can use silicone scar gel, cream, or sheets (such as Mederma or Serica) every night for up to one year. These are available over the counter (without a prescription).  Please call our office at [phone] for any questions or concerns.         Cryotherapy Aftercare  Wash gently with soap and water everyday.   Apply Vaseline and Band-Aid daily until healed.      Seborrheic Keratosis  What causes seborrheic keratoses? Seborrheic keratoses are harmless, common skin growths that first appear during adult life.  As time goes by, more growths appear.  Some people may develop a large number of them.  Seborrheic keratoses appear on both covered and uncovered body parts.  They are not   caused by sunlight.  The tendency to develop seborrheic keratoses can be inherited.  They vary in color from skin-colored to gray, brown, or even black.  They can be either smooth or have a rough, warty surface.   Seborrheic keratoses are superficial and look as if they were stuck on the skin.  Under the microscope this type of keratosis looks like layers upon layers of skin.  That is why at times the top layer may seem to fall off, but the rest of the growth remains and re-grows.    Treatment Seborrheic keratoses do not need to be treated, but can easily be removed in the office.  Seborrheic keratoses often cause symptoms when  they rub on clothing or jewelry.  Lesions can be in the way of shaving.  If they become inflamed, they can cause itching, soreness, or burning.  Removal of a seborrheic keratosis can be accomplished by freezing, burning, or surgery. If any spot bleeds, scabs, or grows rapidly, please return to have it checked, as these can be an indication of a skin cancer.        Due to recent changes in healthcare laws, you may see results of your pathology and/or laboratory studies on MyChart before the doctors have had a chance to review them. We understand that in some cases there may be results that are confusing or concerning to you. Please understand that not all results are received at the same time and often the doctors may need to interpret multiple results in order to provide you with the best plan of care or course of treatment. Therefore, we ask that you please give us 2 business days to thoroughly review all your results before contacting the office for clarification. Should we see a critical lab result, you will be contacted sooner.   If You Need Anything After Your Visit  If you have any questions or concerns for your doctor, please call our main line at [phone] and press option 4 to reach your doctor's medical assistant. If no one answers, please leave a voicemail as directed and we will return your call as soon as possible. Messages left after 4 pm will be answered the following business day.   You may also send us a message via MyChart. We typically respond to MyChart messages within 1-2 business days.  For prescription refills, please ask your pharmacy to contact our office. Our fax number is [phone].  If you have an urgent issue when the clinic is closed that cannot wait until the next business day, you can page your doctor at the number below.    Please note that while we do our best to be available for urgent issues outside of office hours, we are not available 24/7.   If you  have an urgent issue and are unable to reach us, you may choose to seek medical care at your doctor's office, retail clinic, urgent care center, or emergency room.  If you have a medical emergency, please immediately call 911 or go to the emergency department.  Pager Numbers  - Dr. Kowalski: [phone]  - Dr. Moye: [phone]  - Dr. Stewart: [phone]  In the event of inclement weather, please call our main line at [phone] for an update on the status of any delays or closures.  Dermatology Medication Tips: Please keep the boxes that topical medications come in in order to help keep track of the instructions about where and how to use these. Pharmacies typically print the medication   instructions only on the boxes and not directly on the medication tubes.   If your medication is too expensive, please contact our office at [phone] option 4 or send us a message through MyChart.   We are unable to tell what your co-pay for medications will be in advance as this is different depending on your insurance coverage. However, we may be able to find a substitute medication at lower cost or fill out paperwork to get insurance to cover a needed medication.   If a prior authorization is required to get your medication covered by your insurance company, please allow us 1-2 business days to complete this process.  Drug prices often vary depending on where the prescription is filled and some pharmacies may offer cheaper prices.  The website www.goodrx.com contains coupons for medications through different pharmacies. The prices here do not account for what the cost may be with help from insurance (it may be cheaper with your insurance), but the website can give you the price if you did not use any insurance.  - You can print the associated coupon and take it with your prescription to the pharmacy.  - You may also stop by our office during regular business hours and pick up a GoodRx coupon  card.  - If you need your prescription sent electronically to a different pharmacy, notify our office through [facility] MyChart or by phone at [phone] option 4.     Si Usted Necesita Algo Despus de Su Visita  Tambin puede enviarnos un mensaje a travs de MyChart. Por lo general respondemos a los mensajes de MyChart en el transcurso de 1 a 2 das hbiles.  Para renovar recetas, por favor pida a su farmacia que se ponga en contacto con nuestra oficina. Nuestro nmero de fax es el [phone].  Si tiene un asunto urgente cuando la clnica est cerrada y que no puede esperar hasta el siguiente da hbil, puede llamar/localizar a su doctor(a) al nmero que aparece a continuacin.   Por favor, tenga en cuenta que aunque hacemos todo lo posible para estar disponibles para asuntos urgentes fuera del horario de oficina, no estamos disponibles las 24 horas del da, los 7 das de la semana.   Si tiene un problema urgente y no puede comunicarse con nosotros, puede optar por buscar atencin mdica  en el consultorio de su doctor(a), en una clnica privada, en un centro de atencin urgente o en una sala de emergencias.  Si tiene una emergencia mdica, por favor llame inmediatamente al 911 o vaya a la sala de emergencias.  Nmeros de bper  - Dr. Kowalski: [phone]  - Dra. Moye: [phone]  - Dra. Stewart: [phone]  En caso de inclemencias del tiempo, por favor llame a nuestra lnea principal al [phone] para una actualizacin sobre el estado de cualquier retraso o cierre.  Consejos para la medicacin en dermatologa: Por favor, guarde las cajas en las que vienen los medicamentos de uso tpico para ayudarle a seguir las instrucciones sobre dnde y cmo usarlos. Las farmacias generalmente imprimen las instrucciones del medicamento slo en las cajas y no directamente en los tubos del medicamento.   Si su medicamento es muy caro, por favor, pngase en contacto con nuestra  oficina llamando al [phone] y presione la opcin 4 o envenos un mensaje a travs de MyChart.   No podemos decirle cul ser su copago por los medicamentos por adelantado ya que esto es diferente dependiendo de la cobertura de su seguro. Sin embargo,   es posible que podamos encontrar un medicamento sustituto a menor costo o llenar un formulario para que el seguro cubra el medicamento que se considera necesario.   Si se requiere una autorizacin previa para que su compaa de seguros cubra su medicamento, por favor permtanos de 1 a 2 das hbiles para completar este proceso.  Los precios de los medicamentos varan con frecuencia dependiendo del lugar de dnde se surte la receta y alguna farmacias pueden ofrecer precios ms baratos.  El sitio web www.goodrx.com tiene cupones para medicamentos de diferentes farmacias. Los precios aqu no tienen en cuenta lo que podra costar con la ayuda del seguro (puede ser ms barato con su seguro), pero el sitio web puede darle el precio si no utiliz ningn seguro.  - Puede imprimir el cupn correspondiente y llevarlo con su receta a la farmacia.  - Tambin puede pasar por nuestra oficina durante el horario de atencin regular y recoger una tarjeta de cupones de GoodRx.  - Si necesita que su receta se enve electrnicamente a una farmacia diferente, informe a nuestra oficina a travs de MyChart de [facility] o por telfono llamando al [phone] y presione la opcin 4.  

## 2022-05-08 NOTE — Progress Notes (Signed)
Follow-Up Visit   Subjective  Carl Crawford is a 62 y.o. male who presents for the following: patient here for 3 month isk follow up. Reports some spots at left and right neck, back, left thigh, and face.  The patient has spots, moles and lesions to be evaluated, some may be new or changing and the patient has concerns that these could be cancer.  The following portions of the chart were reviewed this encounter and updated as appropriate: medications, allergies, medical history  Review of Systems:  No other skin or systemic complaints except as noted in HPI or Assessment and Plan.  Objective  Well appearing patient in no apparent distress; mood and affect are within normal limits.  A focused examination was performed of the following areas: Back, neck, face  Relevant exam findings are noted in the Assessment and Plan.  left cheek 1.1 cm hyperkeratotic papule         Assessment & Plan   INFLAMED SEBORRHEIC KERATOSIS  Exam: Erythematous keratotic or waxy stuck-on papule or plaque.  Symptomatic, irritating, patient would like treated.  Benign-appearing.  Call clinic for new or changing lesions.   Prior to procedure, discussed risks of blister formation, small wound, skin dyspigmentation, or rare scar following treatment. Recommend Vaseline ointment to treated areas while healing.  Destruction Procedure Note Destruction method: cryotherapy   Informed consent: discussed and consent obtained   Lesion destroyed using liquid nitrogen: Yes   Outcome: patient tolerated procedure well with no complications   Post-procedure details: wound care instructions given   Locations: lt and right leg area x2 , right neck x 15  left shoulder and neck x 16 # of Lesions Treated: 33   Neoplasm of uncertain behavior left cheek  Epidermal / dermal shaving  Lesion diameter (cm):  1.1 Informed consent: discussed and consent obtained   Timeout: patient name, date of birth, surgical site,  and procedure verified   Patient was prepped and draped in usual sterile fashion: Area prepped with alcohol. Anesthesia: the lesion was anesthetized in a standard fashion   Anesthetic:  1% lidocaine w/ epinephrine 1-100,000 buffered w/ 8.4% NaHCO3 Instrument used: flexible razor blade   Hemostasis achieved with: pressure, aluminum chloride and electrodesiccation   Outcome: patient tolerated procedure well   Post-procedure details: wound care instructions given   Post-procedure details comment:  Ointment and small bandage applied.   Destruction of lesion Complexity: extensive   Destruction method: electrodesiccation and curettage   Informed consent: discussed and consent obtained   Timeout:  patient name, date of birth, surgical site, and procedure verified Procedure prep:  Patient was prepped and draped in usual sterile fashion Prep type:  Isopropyl alcohol Anesthesia: the lesion was anesthetized in a standard fashion   Anesthetic:  1% lidocaine w/ epinephrine 1-100,000 buffered w/ 8.4% NaHCO3 Curettage performed in three different directions: Yes   Electrodesiccation performed over the curetted area: Yes   Lesion length (cm):  1.1 Lesion width (cm):  1.1 Margin per side (cm):  0.2 Final wound size (cm):  1.5 Hemostasis achieved with:  pressure, aluminum chloride and electrodesiccation Outcome: patient tolerated procedure well with no complications   Post-procedure details: sterile dressing applied and wound care instructions given   Dressing type: bandage and petrolatum    Specimen 1 - Surgical pathology Differential Diagnosis: Wart vs SCC  Check Margins: No  Wart vs SCC  Inflamed seborrheic keratosis  Seborrheic keratosis  Actinic skin damage  ACTINIC DAMAGE - chronic, secondary to cumulative  UV radiation exposure/sun exposure over time - diffuse scaly erythematous macules with underlying dyspigmentation - Recommend daily broad spectrum sunscreen SPF 30+ to sun-exposed  areas, reapply every 2 hours as needed.  - Recommend staying in the shade or wearing long sleeves, sun glasses (UVA+UVB protection) and wide brim hats (4-inch brim around the entire circumference of the hat). - Call for new or changing lesions.  SEBORRHEIC KERATOSIS - Stuck-on, waxy, tan-brown papules and/or plaques  - Benign-appearing - Discussed benign etiology and prognosis. - Observe - Call for any changes  Return for 4 month isk followup.  IAsher Muir, CMA, am acting as scribe for Armida Sans, MD.  Documentation: I have reviewed the above documentation for accuracy and completeness, and I agree with the above.  Armida Sans, MD

## 2022-05-14 ENCOUNTER — Telehealth: Payer: Self-pay

## 2022-05-14 NOTE — Telephone Encounter (Signed)
Left message for patient to call regarding pathology results/hd 

## 2022-05-14 NOTE — Telephone Encounter (Signed)
-----   Message from Deirdre Evener, MD sent at 05/14/2022 12:32 PM EDT ----- Diagnosis Skin , left cheek VERRUCA VULGARIS, IRRITATED  Benign irritated viral wart May recur Recheck next visit

## 2022-05-15 ENCOUNTER — Telehealth: Payer: Self-pay

## 2022-05-15 ENCOUNTER — Other Ambulatory Visit: Payer: Self-pay | Admitting: Physician Assistant

## 2022-05-15 DIAGNOSIS — E782 Mixed hyperlipidemia: Secondary | ICD-10-CM

## 2022-05-15 NOTE — Telephone Encounter (Signed)
Discussed biopsy results with patient 

## 2022-05-15 NOTE — Telephone Encounter (Signed)
-----   Message from David C Kowalski, MD sent at 05/14/2022 12:32 PM EDT ----- Diagnosis Skin , left cheek VERRUCA VULGARIS, IRRITATED  Benign irritated viral wart May recur Recheck next visit 

## 2022-05-26 ENCOUNTER — Encounter: Payer: Self-pay | Admitting: Dermatology

## 2022-06-06 DIAGNOSIS — E785 Hyperlipidemia, unspecified: Secondary | ICD-10-CM | POA: Diagnosis not present

## 2022-06-06 DIAGNOSIS — K219 Gastro-esophageal reflux disease without esophagitis: Secondary | ICD-10-CM | POA: Diagnosis not present

## 2022-06-06 DIAGNOSIS — I10 Essential (primary) hypertension: Secondary | ICD-10-CM | POA: Diagnosis not present

## 2022-06-06 DIAGNOSIS — Z87891 Personal history of nicotine dependence: Secondary | ICD-10-CM | POA: Diagnosis not present

## 2022-06-06 DIAGNOSIS — Z008 Encounter for other general examination: Secondary | ICD-10-CM | POA: Diagnosis not present

## 2022-06-12 ENCOUNTER — Other Ambulatory Visit: Payer: Self-pay | Admitting: Physician Assistant

## 2022-06-12 DIAGNOSIS — I10 Essential (primary) hypertension: Secondary | ICD-10-CM

## 2022-07-10 NOTE — Progress Notes (Signed)
I,Vanessa  Vital,acting as a Neurosurgeon for [employer], PA-C.,have documented all relevant documentation on the behalf of Alfredia Ferguson, PA-C,as directed by  Alfredia Ferguson, PA-C while in the presence of Alfredia Ferguson, PA-C.   Established patient visit   Patient: Carl Crawford   DOB: [birth year]   62 y.o. Male  MRN: [id number] Visit Date: 07/11/2022  Today's healthcare provider: Alfredia Ferguson, PA-C   Cc. Htn f/u  Subjective    HPI   Hypertension, follow-up  BP Readings from Last 3 Encounters:  07/11/22 (!) 138/98  05/08/22 132/82  01/09/22 136/88   Wt Readings from Last 3 Encounters:  07/11/22 216 lb 3.2 oz (98.1 kg)  01/09/22 215 lb 14.4 oz (97.9 kg)  10/30/21 212 lb 3.2 oz (96.3 kg)     He was last seen for hypertension 6 months ago.  BP at that visit was 136/88. Management since that visit includes no changes.  He reports excellent compliance with treatment. He is not having side effects.  He is following a Regular diet. He is not exercising. He does not smoke.  Outside blood pressures are does not check Symptoms: No chest pain No chest pressure  No palpitations No syncope  No dyspnea No orthopnea  No paroxysmal nocturnal dyspnea No lower extremity edema   Pertinent labs Lab Results  Component Value Date   CHOL 142 10/16/2021   HDL 41 10/16/2021   LDLCALC 88 10/16/2021   TRIG 62 10/16/2021   CHOLHDL 3.5 10/16/2021   Lab Results  Component Value Date   NA 138 10/16/2021   K 4.4 10/16/2021   CREATININE 0.83 10/16/2021   EGFR 98 10/16/2021   GLUCOSE 158 (H) 10/16/2021     The 10-year ASCVD risk score (Arnett DK, et al., 2019) is: 13.7%  ---------------------------------------------------------------------------------------------------   Medications: Outpatient Medications Prior to Visit  Medication Sig   atorvastatin (LIPITOR) 20 MG tablet TAKE 1 TABLET BY MOUTH EVERY DAY   Blood Pressure Monitoring (BLOOD PRESSURE KIT) DEVI Use to check  blood pressure at home daily   esomeprazole (NEXIUM) 20 MG capsule Take 20 mg by mouth daily at 12 noon.   hydrochlorothiazide (MICROZIDE) 12.5 MG capsule TAKE 1 CAPSULE BY MOUTH EVERY DAY FOR BLOOD PRESSURE   Multiple Vitamins-Minerals (MENS MULTIVITAMIN PO) Take by mouth.   triamcinolone cream (KENALOG) 0.1 % Apply 1 application. topically 2 (two) times daily. On areas that itch   [DISCONTINUED] losartan (COZAAR) 50 MG tablet TAKE 1 TABLET BY MOUTH EVERY DAY   No facility-administered medications prior to visit.    Review of Systems  Constitutional:  Negative for fatigue and fever.  Respiratory:  Negative for cough and shortness of breath.   Cardiovascular:  Negative for chest pain, palpitations and leg swelling.  Neurological:  Negative for dizziness and headaches.      Objective    BP (!) 138/98 (BP Location: Right Arm, Patient Position: Sitting, Cuff Size: Normal)   Pulse 71   Temp 97.7 F (36.5 C) (Oral)   Resp 13   Ht 6\' 1"  (1.854 m)   Wt 216 lb 3.2 oz (98.1 kg)   SpO2 100%   BMI 28.52 kg/m   Physical Exam Constitutional:      General: He is awake.     Appearance: He is well-developed.  HENT:     Head: Normocephalic.  Eyes:     Conjunctiva/sclera: Conjunctivae normal.  Cardiovascular:     Rate and Rhythm: Normal rate and regular rhythm.  Heart sounds: Normal heart sounds.  Pulmonary:     Effort: Pulmonary effort is normal.     Breath sounds: Normal breath sounds.  Skin:    General: Skin is warm.  Neurological:     Mental Status: He is alert and oriented to person, place, and time.  Psychiatric:        Attention and Perception: Attention normal.        Mood and Affect: Mood normal.        Speech: Speech normal.        Behavior: Behavior is cooperative.     No results found for any visits on 07/11/22.  Assessment & Plan     Problem List Items Addressed This Visit       Cardiovascular and Mediastinum   Primary hypertension - Primary    Moderate  control today . Pt manages with losartan 100 mg and hctz 12.5 mg -- was taking 2 losartan 50 mg, looks like recent script wasn't changed to reflect this, and refill schedule looks like he might have been only taking one.  Pt is not a good historian, only knows his meds by colors.  Will monitor f.u 3-4 mo Ordered cmp      Relevant Medications   losartan (COZAAR) 50 MG tablet   Other Relevant Orders   Comprehensive Metabolic Panel (CMET)   Lipid Profile   CBC w/Diff/Platelet     Other   Mixed hyperlipidemia    Managed with lipitor 20 mg repeat fasting lipids      Relevant Medications   losartan (COZAAR) 50 MG tablet   Other Relevant Orders   Comprehensive Metabolic Panel (CMET)   Lipid Profile   Prediabetes    Repeat a1c      Relevant Orders   HgB A1c   Other Visit Diagnoses     Screening for prostate cancer       Relevant Orders   PSA        Return in about 3 months (around 10/11/2022) for hypertension.     I, Alfredia Ferguson, PA-C have reviewed all documentation for this visit. The documentation on  07/11/22   for the exam, diagnosis, procedures, and orders are all accurate and complete.  Alfredia Ferguson, PA-C [facility] [address] [place], [place], [address] Office: [phone] Fax: [phone]   [facility]

## 2022-07-11 ENCOUNTER — Encounter: Payer: Self-pay | Admitting: Physician Assistant

## 2022-07-11 ENCOUNTER — Ambulatory Visit: Payer: 59 | Admitting: Physician Assistant

## 2022-07-11 VITALS — BP 138/98 | HR 71 | Temp 97.7°F | Resp 13 | Ht 73.0 in | Wt 216.2 lb

## 2022-07-11 DIAGNOSIS — Z125 Encounter for screening for malignant neoplasm of prostate: Secondary | ICD-10-CM

## 2022-07-11 DIAGNOSIS — R7303 Prediabetes: Secondary | ICD-10-CM

## 2022-07-11 DIAGNOSIS — I10 Essential (primary) hypertension: Secondary | ICD-10-CM

## 2022-07-11 DIAGNOSIS — E782 Mixed hyperlipidemia: Secondary | ICD-10-CM

## 2022-07-11 MED ORDER — LOSARTAN POTASSIUM 50 MG PO TABS: 100.0000 mg | ORAL_TABLET | Freq: Every day | ORAL | 5 refills | Status: DC

## 2022-07-11 NOTE — Assessment & Plan Note (Signed)
Managed with lipitor 20 mg repeat fasting lipids

## 2022-07-11 NOTE — Assessment & Plan Note (Signed)
Moderate control today . Pt manages with losartan 100 mg and hctz 12.5 mg -- was taking 2 losartan 50 mg, looks like recent script wasn't changed to reflect this, and refill schedule looks like he might have been only taking one.  Pt is not a good historian, only knows his meds by colors.  Will monitor f.u 3-4 mo Ordered cmp

## 2022-07-11 NOTE — Assessment & Plan Note (Signed)
Repeat a1c 

## 2022-07-12 LAB — CBC WITH DIFFERENTIAL/PLATELET
Basophils Absolute: 0 10*3/uL (ref 0.0–0.2)
Basos: 1 %
EOS (ABSOLUTE): 0.1 10*3/uL (ref 0.0–0.4)
Eos: 2 %
Hematocrit: 41.6 % (ref 37.5–51.0)
Hemoglobin: 14.7 g/dL (ref 13.0–17.7)
Immature Grans (Abs): 0 10*3/uL (ref 0.0–0.1)
Immature Granulocytes: 0 %
Lymphocytes Absolute: 1.9 10*3/uL (ref 0.7–3.1)
Lymphs: 32 %
MCH: 30.9 pg (ref 26.6–33.0)
MCHC: 35.3 g/dL (ref 31.5–35.7)
MCV: 87 fL (ref 79–97)
Monocytes Absolute: 0.8 10*3/uL (ref 0.1–0.9)
Monocytes: 14 %
Neutrophils Absolute: 3.2 10*3/uL (ref 1.4–7.0)
Neutrophils: 51 %
Platelets: 270 10*3/uL (ref 150–450)
RBC: 4.76 x10E6/uL (ref 4.14–5.80)
RDW: 11.8 % (ref 11.6–15.4)
WBC: 6.1 10*3/uL (ref 3.4–10.8)

## 2022-07-12 LAB — COMPREHENSIVE METABOLIC PANEL
ALT: 78 IU/L — ABNORMAL HIGH (ref 0–44)
AST: 48 IU/L — ABNORMAL HIGH (ref 0–40)
Albumin/Globulin Ratio: 1.6
Albumin: 4.4 g/dL (ref 3.9–4.9)
Alkaline Phosphatase: 73 IU/L (ref 44–121)
BUN/Creatinine Ratio: 16 (ref 10–24)
BUN: 15 mg/dL (ref 8–27)
Bilirubin Total: 0.3 mg/dL (ref 0.0–1.2)
CO2: 25 mmol/L (ref 20–29)
Calcium: 9.6 mg/dL (ref 8.6–10.2)
Chloride: 103 mmol/L (ref 96–106)
Creatinine, Ser: 0.95 mg/dL (ref 0.76–1.27)
Globulin, Total: 2.7 g/dL (ref 1.5–4.5)
Glucose: 91 mg/dL (ref 70–99)
Potassium: 3.9 mmol/L (ref 3.5–5.2)
Sodium: 141 mmol/L (ref 134–144)
Total Protein: 7.1 g/dL (ref 6.0–8.5)
eGFR: 89 mL/min/{1.73_m2} (ref >59)

## 2022-07-12 LAB — LIPID PANEL
Chol/HDL Ratio: 4 ratio (ref 0.0–5.0)
Cholesterol, Total: 148 mg/dL (ref 100–199)
HDL: 37 mg/dL — ABNORMAL LOW (ref >39)
LDL Chol Calc (NIH): 89 mg/dL (ref 0–99)
Triglycerides: 120 mg/dL (ref 0–149)
VLDL Cholesterol Cal: 22 mg/dL (ref 5–40)

## 2022-07-12 LAB — HEMOGLOBIN A1C
Est. average glucose Bld gHb Est-mCnc: 154 mg/dL
Hgb A1c MFr Bld: 7 % — ABNORMAL HIGH (ref 4.8–5.6)

## 2022-07-12 LAB — PSA: Prostate Specific Ag, Serum: 0.4 ng/mL (ref 0.0–4.0)

## 2022-07-15 ENCOUNTER — Ambulatory Visit: Payer: 59 | Admitting: Family Medicine

## 2022-07-15 ENCOUNTER — Encounter: Payer: Self-pay | Admitting: Family Medicine

## 2022-07-15 VITALS — BP 145/84 | HR 71 | Temp 97.8°F | Resp 12 | Wt 214.0 lb

## 2022-07-15 DIAGNOSIS — E1165 Type 2 diabetes mellitus with hyperglycemia: Secondary | ICD-10-CM | POA: Diagnosis not present

## 2022-07-15 DIAGNOSIS — I10 Essential (primary) hypertension: Secondary | ICD-10-CM | POA: Diagnosis not present

## 2022-07-15 MED ORDER — LANCETS MISC. MISC: 1.0000 | Freq: Every day | 3 refills | Status: DC

## 2022-07-15 MED ORDER — BLOOD GLUCOSE MONITORING SUPPL DEVI
1.0000 | Freq: Every day | 0 refills | Status: DC
Start: 2022-07-15 — End: 2023-12-17

## 2022-07-15 MED ORDER — BLOOD GLUCOSE TEST VI STRP
1.0000 | ORAL_STRIP | Freq: Every day | 3 refills | Status: DC
Start: 2022-07-15 — End: 2023-12-17

## 2022-07-15 MED ORDER — METFORMIN HCL ER 500 MG PO TB24
500.0000 mg | ORAL_TABLET | Freq: Every day | ORAL | 1 refills | Status: DC
Start: 2022-07-15 — End: 2022-08-05

## 2022-07-15 MED ORDER — LOSARTAN POTASSIUM 100 MG PO TABS
100.0000 mg | ORAL_TABLET | Freq: Every day | ORAL | 1 refills | Status: DC
Start: 2022-07-15 — End: 2022-09-13

## 2022-07-15 MED ORDER — LANCET DEVICE MISC
1.0000 | Freq: Every day | 0 refills | Status: DC
Start: 2022-07-15 — End: 2022-08-28

## 2022-07-15 NOTE — Progress Notes (Signed)
Carl Crawford,acting as a Neurosurgeon for [employer], DO.,have documented all relevant documentation on the behalf of [employer], DO,as directed by  [employer], DO while in the presence of Carl Hay, DO.     Established patient visit   Patient: Carl Crawford   DOB: 15-Sep-1960   62 y.o. Male  MRN: [id number] Visit Date: 07/15/2022  Today's healthcare provider: [name], DO   Chief Complaint  Patient presents with   Diabetes   Subjective    Diabetes He presents for his initial diabetic visit. He has type 2 diabetes mellitus. Hypoglycemia symptoms include sleepiness. Pertinent negatives for hypoglycemia include no confusion, dizziness, headaches or hunger. Associated symptoms include fatigue and polyuria. Pertinent negatives for diabetes include no blurred vision, no chest pain, no foot paresthesias, no foot ulcerations, no polydipsia, no polyphagia, no visual change, no weakness and no weight loss.    Patient here with a friend, Carl Crawford.  His friend is here to help him monitor and manage his diabetes, as the patient has poor health literacy and is a poor historian; his friend also has diabetes and is accustomed to managing it.  Patient presents for follow-up of prediabetes, with most recent A1c result of 7.0 on 07/11/2022.  He does not yet have any equipment, such as a glucometer, to manage his diabetes.  He has not been reducing carbohydrate intake in his day-to-day diet and frequently enjoys [place] fries.  Previous ophthalmology exam was performed approximately 2 years ago.  Regarding his hypertension, he does not monitor his blood pressure at home.  He has been told in the past to take a higher dose of his losartan (two pills), but his friend endorses that he has only been taking 1 tablet/day.     Medications: Outpatient Medications Prior to Visit  Medication Sig   atorvastatin (LIPITOR) 20 MG tablet TAKE 1 TABLET BY MOUTH EVERY DAY   esomeprazole  (NEXIUM) 20 MG capsule Take 20 mg by mouth daily at 12 noon.   hydrochlorothiazide (MICROZIDE) 12.5 MG capsule TAKE 1 CAPSULE BY MOUTH EVERY DAY FOR BLOOD PRESSURE   Multiple Vitamins-Minerals (MENS MULTIVITAMIN PO) Take by mouth.   triamcinolone cream (KENALOG) 0.1 % Apply 1 application. topically 2 (two) times daily. On areas that itch   [DISCONTINUED] losartan (COZAAR) 50 MG tablet Take 100 mg by mouth daily.   [DISCONTINUED] Blood Pressure Monitoring (BLOOD PRESSURE KIT) DEVI Use to check blood pressure at home daily   [DISCONTINUED] losartan (COZAAR) 50 MG tablet Take 2 tablets (100 mg total) by mouth daily.   No facility-administered medications prior to visit.    Review of Systems  Constitutional:  Positive for fatigue. Negative for weight loss.  Eyes:  Negative for blurred vision.  Cardiovascular:  Negative for chest pain.  Endocrine: Positive for polyuria. Negative for polydipsia and polyphagia.  Neurological:  Negative for dizziness, weakness and headaches.  Psychiatric/Behavioral:  Negative for confusion.        Objective    BP (!) 145/84 (BP Location: Right Arm, Patient Position: Sitting, Cuff Size: Large)   Pulse 71   Temp 97.8 F (36.6 C) (Temporal)   Resp 12   Wt 214 lb (97.1 kg)   SpO2 97%   BMI 28.23 kg/m    Physical Exam Vitals reviewed.  Constitutional:      General: He is not in acute distress.    Appearance: Normal appearance. He is not diaphoretic.  HENT:  Head: Normocephalic and atraumatic.  Eyes:     General: No scleral icterus.    Conjunctiva/sclera: Conjunctivae normal.  Cardiovascular:     Rate and Rhythm: Normal rate and regular rhythm.     Pulses: Normal pulses.     Heart sounds: Normal heart sounds. No murmur heard. Pulmonary:     Effort: Pulmonary effort is normal. No respiratory distress.     Breath sounds: Normal breath sounds. No wheezing or rhonchi.  Musculoskeletal:     Cervical back: Neck supple.     Right lower leg: No  edema.     Left lower leg: No edema.  Lymphadenopathy:     Cervical: No cervical adenopathy.  Skin:    General: Skin is warm and dry.     Findings: No rash.  Neurological:     Mental Status: He is alert and oriented to person, place, and time. Mental status is at baseline.  Psychiatric:        Mood and Affect: Mood normal.        Behavior: Behavior normal.      No results found for any visits on 07/15/22.  Assessment & Plan    1. Type 2 diabetes mellitus with hyperglycemia, without long-term current use of insulin (HCC)    Counseled patient regarding diabetes and the management of it.  Also discussed options for treatment; prescribed metformin as his insurance plan appears to have a step up prescription recommendation prior to starting any SGLT2s/DPP4s/GLP-1s.  Did prescribe extended release version to reduce the likelihood of side effects.    Counseled patient reducing intake of carbohydrates, especially french fries, white rice, white breads,potatoes/other starchy vegetables and increasing intake of vegetables and some fruits (such as berries, apples, etc) but not grapes and bananas.  Also submitted referral to nutrition and diabetes services so that he can have intensive counseling regarding how to best manage his diabetes in terms of diet and exercise.    Advised patient that he will need to schedule an ophthalmology exam, as he states his prior one was 2 years ago.    Unable to order UACR today as patient and his friend did not have additional time.  Will plan to obtain on next visit.    Printed out patient's AVS for him to have a physical copy of the patient instructions and more specifically the clinical references I included; went over these with patient. - Referral to Nutrition and Diabetes Services - Blood Glucose Monitoring Suppl DEVI; 1 each by Does not apply route daily before breakfast. May substitute to any manufacturer covered by patient's insurance.  Dispense: 1 each;  Refill: 0 - Glucose Blood (BLOOD GLUCOSE TEST STRIPS) STRP; 1 each by In Vitro route daily before breakfast. May substitute to any manufacturer covered by patient's insurance.  Dispense: 100 strip; Refill: 3 - Lancet Device MISC; 1 each by Does not apply route daily before breakfast. May substitute to any manufacturer covered by patient's insurance.  Dispense: 1 each; Refill: 0 - Lancets Misc. MISC; 1 each by Does not apply route daily before breakfast. May substitute to any manufacturer covered by patient's insurance.  Dispense: 100 each; Refill: 3 - metFORMIN (GLUCOPHAGE-XR) 500 MG 24 hr tablet; Take 1 tablet (500 mg total) by mouth daily with breakfast.  Dispense: 30 tablet; Refill: 1  2. Primary hypertension Prescribe single tablet dose of losartan as noted below to facilitate medication compliance, as patient has difficulty remembering/following instructions given during visits. - losartan (COZAAR) 100 MG tablet; Take  1 tablet (100 mg total) by mouth daily.  Dispense: 30 tablet; Refill: 1    Return in about 3 weeks (around 08/05/2022) for DM/uACR.      The entirety of the information documented in the History of Present Illness, Review of Systems and Physical Exam were personally obtained by me. Portions of this information were initially documented by the CMA, Hyacinth Meeker, and reviewed by me for thoroughness and accuracy.   I discussed the assessment and treatment plan with the patient  The patient was provided an opportunity to ask questions and all were answered. The patient agreed with the plan and demonstrated an understanding of the instructions.   The patient was advised to call back or seek an in-person evaluation if the symptoms worsen or if the condition fails to improve as anticipated.    Carl Hay, DO  [facility] [phone] (phone) [phone] (fax)  [facility]

## 2022-07-15 NOTE — Patient Instructions (Signed)
Bring log of blood sugars to next visit  Do NOT pick up losartan 50mg ; this has been REPLACED with losartan 100mg .

## 2022-07-17 ENCOUNTER — Telehealth: Payer: Self-pay | Admitting: Podiatry

## 2022-07-17 NOTE — Telephone Encounter (Signed)
Lvm to schedule an appt for [facility].

## 2022-08-05 ENCOUNTER — Ambulatory Visit: Payer: 59 | Admitting: Family Medicine

## 2022-08-05 ENCOUNTER — Encounter: Payer: Self-pay | Admitting: Family Medicine

## 2022-08-05 VITALS — BP 148/88 | HR 74 | Temp 97.8°F | Ht 73.0 in | Wt 207.8 lb

## 2022-08-05 DIAGNOSIS — E1169 Type 2 diabetes mellitus with other specified complication: Secondary | ICD-10-CM

## 2022-08-05 DIAGNOSIS — E782 Mixed hyperlipidemia: Secondary | ICD-10-CM

## 2022-08-05 DIAGNOSIS — I10 Essential (primary) hypertension: Secondary | ICD-10-CM | POA: Diagnosis not present

## 2022-08-05 DIAGNOSIS — L918 Other hypertrophic disorders of the skin: Secondary | ICD-10-CM | POA: Diagnosis not present

## 2022-08-05 MED ORDER — HYDROCHLOROTHIAZIDE 25 MG PO TABS
25.0000 mg | ORAL_TABLET | Freq: Every day | ORAL | 1 refills | Status: DC
Start: 2022-08-05 — End: 2022-09-10

## 2022-08-05 MED ORDER — DEXCOM G7 SENSOR MISC
1 refills | Status: DC
Start: 2022-08-05 — End: 2023-09-03

## 2022-08-05 MED ORDER — EMPAGLIFLOZIN 10 MG PO TABS
10.0000 mg | ORAL_TABLET | Freq: Every day | ORAL | 1 refills | Status: DC
Start: 2022-08-05 — End: 2022-08-23

## 2022-08-05 MED ORDER — DEXCOM G7 RECEIVER DEVI: 0 refills | Status: DC

## 2022-08-05 MED ORDER — ROSUVASTATIN CALCIUM 20 MG PO TABS: 20.0000 mg | ORAL_TABLET | Freq: Every day | ORAL | 3 refills | Status: DC

## 2022-08-05 NOTE — Progress Notes (Signed)
Established patient visit   Patient: Carl Crawford   DOB: 11/30/1960   62 y.o. Male  MRN: [id number] Visit Date: 08/05/2022  Today's healthcare provider: [name], DO   No chief complaint on file.  Subjective    HPI HPI   Pt is here for Diabetes check up. Pt states that Metformin made him nauseous and gave him diarrhea so he stopped taking it about 1 week ago. Pt would also like to discuss his skin tags and what's causing them. Last edited by Daneen Schick, CMA on 08/05/2022  2:30 PM.      Recent blood glucoses: has not been checking Recently diet: Eating salad with cheese, carrots, red onion, and occasional tomatoes in it.     - Occasionally grilled chicken  - Eggs for breakfast. Has had pancakes with syrup. Had some peaches which may have had sugar on them.  - Does like and eat beets  - Had Breyer's no sugar ice cream.  Eye exam - has appointment this Wednesday 08/07/2022  Has previously been to dermatologist for skin tags and is supposed to go back on the 08/29/2022.  Patient has skin tags regrowing on his neck which he believes are unsightly and which also are irritating/itchy to him.  He also has several skin tags in his axilla and in the posterior fold of his knees.  The latter has been treated by dermatology previously with incomplete resolution.  Does he have an appointment with nutrition/dietetics yet? no  Works at [employer].  One of his coworkers who works in the show room tried to help him check his blood sugars, but they were largely unsuccessful.  Patient believes his skin is too thick.    Medications: Outpatient Medications Prior to Visit  Medication Sig Note   esomeprazole (NEXIUM) 20 MG capsule Take 20 mg by mouth daily at 12 noon.    losartan (COZAAR) 100 MG tablet Take 1 tablet (100 mg total) by mouth daily.    triamcinolone cream (KENALOG) 0.1 % Apply 1 application. topically 2 (two) times daily. On areas that itch    [DISCONTINUED] atorvastatin  (LIPITOR) 20 MG tablet TAKE 1 TABLET BY MOUTH EVERY DAY    [DISCONTINUED] hydrochlorothiazide (MICROZIDE) 12.5 MG capsule TAKE 1 CAPSULE BY MOUTH EVERY DAY FOR BLOOD PRESSURE    Blood Glucose Monitoring Suppl DEVI 1 each by Does not apply route daily before breakfast. May substitute to any manufacturer covered by patient's insurance. (Patient not taking: Reported on 08/05/2022)    Glucose Blood (BLOOD GLUCOSE TEST STRIPS) STRP 1 each by In Vitro route daily before breakfast. May substitute to any manufacturer covered by patient's insurance. (Patient not taking: Reported on 08/05/2022)    Lancet Device MISC 1 each by Does not apply route daily before breakfast. May substitute to any manufacturer covered by patient's insurance. (Patient not taking: Reported on 08/05/2022)    Lancets Misc. MISC 1 each by Does not apply route daily before breakfast. May substitute to any manufacturer covered by patient's insurance. (Patient not taking: Reported on 08/05/2022)    Multiple Vitamins-Minerals (MENS MULTIVITAMIN PO) Take by mouth. (Patient not taking: Reported on 08/05/2022)    [DISCONTINUED] metFORMIN (GLUCOPHAGE-XR) 500 MG 24 hr tablet Take 1 tablet (500 mg total) by mouth daily with breakfast. (Patient not taking: Reported on 08/05/2022) 08/05/2022: nausea/diarrhea   No facility-administered medications prior to visit.    Review of Systems  Constitutional:  Negative for unexpected weight change.  Respiratory: Negative.  Negative for cough, shortness of breath and wheezing.   Cardiovascular:  Negative for chest pain, palpitations and leg swelling.  Gastrointestinal:  Negative for abdominal pain and nausea.  Neurological:  Negative for weakness and headaches.       Objective    BP (!) 148/88   Pulse 74   Temp 97.8 F (36.6 C) (Oral)   Ht 6\' 1"  (1.854 m)   Wt 207 lb 12.8 oz (94.3 kg)   SpO2 98%   BMI 27.42 kg/m    The 10-year ASCVD risk score (Arnett DK, et al., 2019) is: 29.8%   Values used to calculate  the score:     Age: 62 years     Sex: Male     Is Non-Hispanic African American: No     Diabetic: Yes     Tobacco smoker: No     Systolic Blood Pressure: 148 mmHg     Is BP treated: Yes     HDL Cholesterol: 37 mg/dL     Total Cholesterol: 148 mg/dL   Physical Exam Vitals and nursing note reviewed.  Constitutional:      General: He is not in acute distress.    Appearance: Normal appearance.  HENT:     Head: Normocephalic and atraumatic.  Eyes:     General: No scleral icterus.    Conjunctiva/sclera: Conjunctivae normal.  Cardiovascular:     Rate and Rhythm: Normal rate.  Pulmonary:     Effort: Pulmonary effort is normal.  Skin:    Findings: Lesion present.          Comments: Very small (2-47mm long x 1mm thick) skin tags with color matching the surrounding skin, scattered across his neck.  Neurological:     Mental Status: He is alert and oriented to person, place, and time. Mental status is at baseline.  Psychiatric:        Mood and Affect: Mood normal.        Behavior: Behavior normal.      No results found for any visits on 08/05/22.  Assessment & Plan    Mixed diabetic hyperlipidemia associated with type 2 diabetes mellitus (HCC) Assessment & Plan: Patient has not been taking his metformin or checking his blood sugars, though he has made an effort to improve his diet somewhat.  It is unclear how effective this has been without knowing his blood sugars.  Counseled patient strongly to resume trying to check his blood sugars.  Demonstrated on both my finger and his how to correctly milk the finger to get blood into the fingertip prior to lancing it to obtain blood.  Patient expressed understanding and stated he would try to check his blood sugar consistently.  Because he was unable to tolerate the metformin, prescribed empagliflozin as noted below.  Gave patient 2 boxes of samples to start with, in case he has to wait for prior auth to go through.  Also prescribed Dexcom G7  sensor CGM and accompanying receiver; however, I cautioned the patient strongly that his insurance may not cover this as he is not on insulin.  Given his learning disability, I suspect having a CGM would significantly help him in correctly choosing foods that will keep his blood sugar under control. - Additionally, for his hyperlipidemia, transitioned patient from atorvastatin to rosuvastatin due to his significantly elevated ASCVD risk score of 29.8%.  Gave patient strict instructions to continue his atorvastatin until he is able to pick up his rosuvastatin.  Printed his AVS and  advised him to show the patient instruction portion to the pharmacist, so the pharmacist can help him correctly dispose of the medications he will no longer need once he has picked up the new medication.  Orders: -     Microalbumin / creatinine urine ratio -     Rosuvastatin Calcium; Take 1 tablet (20 mg total) by mouth daily.  Dispense: 90 tablet; Refill: 3 -     Empagliflozin; Take 1 tablet (10 mg total) by mouth daily. Take one tablet by mouth daily  Dispense: 90 tablet; Refill: 1 -     Dexcom G7 Sensor; Use to check blood sugar as needed for insulin dependent type 2 diabetes  Dispense: 4 each; Refill: 1 -     Dexcom G7 Receiver; Use to check blood sugar as needed for insulin dependent type 2 diabetes  Dispense: 1 each; Refill: 0  Primary hypertension Assessment & Plan: Patient's blood pressure remains elevated even though he has been taking the increased dose of losartan.  Will go ahead and increase his hydrochlorothiazide from 12.5 mg to 25 mg daily.  Gave him strict instructions on this, including in his AVS to show the pharmacist, to try and ensure he is able to make the change over successfully.  This also includes changing the instructions on his current hydrochlorothiazide bottle to take 2 capsules daily rather than the original 1 capsule of his 12.5 mg dose, until he is able to get the new prescription.  Orders: -      hydroCHLOROthiazide; Take 1 tablet (25 mg total) by mouth daily.  Dispense: 30 tablet; Refill: 1  Skin tags, multiple acquired Assessment & Plan: Advised patient that his dermatology appointment is the most feasible place for removal of skin tags and that there are not really any good options for over-the-counter treatment of them.  Discussed that he can try doing apple cider vinegar applications, though this is not a proven method.      Return in about 2 weeks (around 08/19/2022) for HTN, DM.      I discussed the assessment and treatment plan with the patient  The patient was provided an opportunity to ask questions and all were answered. The patient agreed with the plan and demonstrated an understanding of the instructions.   The patient was advised to call back or seek an in-person evaluation if the symptoms worsen or if the condition fails to improve as anticipated.  Total time was 60 minutes. That includes chart review before the visit, the actual patient visit, and time spent on documentation after the visit.    [name], DO  [facility] [phone] (phone) [phone] (fax)  [facility]

## 2022-08-05 NOTE — Assessment & Plan Note (Signed)
Patient's blood pressure remains elevated even though he has been taking the increased dose of losartan.  Will go ahead and increase his hydrochlorothiazide from 12.5 mg to 25 mg daily.  Gave him strict instructions on this, including in his AVS to show the pharmacist, to try and ensure he is able to make the change over successfully.  This also includes changing the instructions on his current hydrochlorothiazide bottle to take 2 capsules daily rather than the original 1 capsule of his 12.5 mg dose, until he is able to get the new prescription.

## 2022-08-05 NOTE — Patient Instructions (Addendum)
Show this information to your pharmacist and have them get rid of the medication you do not need before you leave the pharmacy.  If you pick up your ROSUvastatin today, start the ROSUvastatin and throw away the ATORvastatin. If you are not able to pick up the ROSUvastatin today, continue taking the ATORvastatin until you are able to pick up the ROSUvastatin.  I am sending an increased dosage of hydrochlorothiazide to your pharmacy.  Until you pick this up, take 2 capsules of your hydrochlorothiazide 12.5 mg.  - You will then take 1 capsule of the new hydrochlorothiazide 25 mg capsule. Let us know as soon as possible if your insurance will not cover the empagliflozin (Jardiance).     CHECK YOUR MESSAGES FOR MESSAGE FROM NUTRITION AND DIETETICS; CALL THEM BACK TO SCHEDULE SESSION.   Load Dexcom G7 app from [employer] on your phone or use the Armed forces logistics/support/administrative officer.  If they do not cover it, ask the pharamacist to check and see if the [employer] is covered under your insurance.  Make sure your eye exam record is sent here from your eye doctor when you go on Wednesday.    For skin tags: Try apple cider vinegar soaked on cotton ball and tape over skin tag (such as the one under your arm) for 30 minutes then wash it off. Do this twice daily.  Dab apple cider vinegar on neck skin tags up to three times per day.

## 2022-08-05 NOTE — Assessment & Plan Note (Addendum)
Patient has not been taking his metformin or checking his blood sugars, though he has made an effort to improve his diet somewhat.  It is unclear how effective this has been without knowing his blood sugars.  Counseled patient strongly to resume trying to check his blood sugars.  Demonstrated on both my finger and his how to correctly milk the finger to get blood into the fingertip prior to lancing it to obtain blood.  Patient expressed understanding and stated he would try to check his blood sugar consistently.  Because he was unable to tolerate the metformin, prescribed empagliflozin as noted below.  Gave patient 2 boxes of samples to start with, in case he has to wait for prior auth to go through.  Also prescribed Dexcom G7 sensor CGM and accompanying receiver; however, I cautioned the patient strongly that his insurance may not cover this as he is not on insulin.  Given his learning disability, I suspect having a CGM would significantly help him in correctly choosing foods that will keep his blood sugar under control. - Additionally, for his hyperlipidemia, transitioned patient from atorvastatin to rosuvastatin due to his significantly elevated ASCVD risk score of 29.8%.  Gave patient strict instructions to continue his atorvastatin until he is able to pick up his rosuvastatin.  Printed his AVS and advised him to show the patient instruction portion to the pharmacist, so the pharmacist can help him correctly dispose of the medications he will no longer need once he has picked up the new medication.

## 2022-08-05 NOTE — Assessment & Plan Note (Signed)
Advised patient that his dermatology appointment is the most feasible place for removal of skin tags and that there are not really any good options for over-the-counter treatment of them.  Discussed that he can try doing apple cider vinegar applications, though this is not a proven method.

## 2022-08-06 LAB — MICROALBUMIN / CREATININE URINE RATIO
Creatinine, Urine: 127 mg/dL
Microalb/Creat Ratio: 10 mg/g creat (ref 0–29)
Microalbumin, Urine: 13.1 ug/mL

## 2022-08-07 DIAGNOSIS — H2513 Age-related nuclear cataract, bilateral: Secondary | ICD-10-CM | POA: Diagnosis not present

## 2022-08-07 DIAGNOSIS — E119 Type 2 diabetes mellitus without complications: Secondary | ICD-10-CM | POA: Diagnosis not present

## 2022-08-07 LAB — HM DIABETES EYE EXAM

## 2022-08-10 ENCOUNTER — Other Ambulatory Visit: Payer: Self-pay | Admitting: Physician Assistant

## 2022-08-10 DIAGNOSIS — I10 Essential (primary) hypertension: Secondary | ICD-10-CM

## 2022-08-12 NOTE — Telephone Encounter (Signed)
Requested Prescriptions  Refused Prescriptions Disp Refills   hydrochlorothiazide (MICROZIDE) 12.5 MG capsule [Pharmacy Med Name: HYDROCHLOROTHIAZIDE 12.5 MG CP] 30 capsule 5    Sig: TAKE 1 CAPSULE BY MOUTH EVERY DAY FOR BLOOD PRESSURE     Cardiovascular: Diuretics - Thiazide Failed - 08/10/2022  9:04 AM      Failed - Last BP in normal range    BP Readings from Last 1 Encounters:  08/05/22 (!) 148/88         Passed - Cr in normal range and within 180 days    Creatinine, Ser  Date Value Ref Range Status  07/11/2022 0.95 0.76 - 1.27 mg/dL Final         Passed - K in normal range and within 180 days    Potassium  Date Value Ref Range Status  07/11/2022 3.9 3.5 - 5.2 mmol/L Final         Passed - Na in normal range and within 180 days    Sodium  Date Value Ref Range Status  07/11/2022 141 134 - 144 mmol/L Final         Passed - Valid encounter within last 6 months    Recent Outpatient Visits           1 week ago Mixed diabetic hyperlipidemia associated with type 2 diabetes mellitus [facility])   [place] [facility] Pardue, Monico Blitz, DO   4 weeks ago Type 2 diabetes mellitus with hyperglycemia, without long-term current use of insulin [facility])   [facility] Pardue, Monico Blitz, DO   1 month ago Primary hypertension   [place] [facility] Alfredia Ferguson, PA-C   7 months ago Primary hypertension   [place] [facility] Edwards, [place], PA-C   9 months ago Upper respiratory tract infection, unspecified type   [facility] [facility] Simmons-Robinson, Tawanna Cooler, MD       Future Appointments             In 1 week Pardue, Monico Blitz, DO [facility]   In 2 weeks Deirdre Evener, MD [facility]

## 2022-08-21 ENCOUNTER — Encounter: Payer: Self-pay | Admitting: Family Medicine

## 2022-08-21 ENCOUNTER — Ambulatory Visit (INDEPENDENT_AMBULATORY_CARE_PROVIDER_SITE_OTHER): Payer: 59 | Admitting: Family Medicine

## 2022-08-21 VITALS — BP 126/86 | HR 75 | Temp 97.9°F | Ht 73.0 in | Wt 204.0 lb

## 2022-08-21 DIAGNOSIS — B353 Tinea pedis: Secondary | ICD-10-CM

## 2022-08-21 DIAGNOSIS — E1165 Type 2 diabetes mellitus with hyperglycemia: Secondary | ICD-10-CM | POA: Diagnosis not present

## 2022-08-21 DIAGNOSIS — E782 Mixed hyperlipidemia: Secondary | ICD-10-CM | POA: Diagnosis not present

## 2022-08-21 DIAGNOSIS — I10 Essential (primary) hypertension: Secondary | ICD-10-CM | POA: Diagnosis not present

## 2022-08-21 MED ORDER — KETOCONAZOLE 2 % EX CREA
1.0000 | TOPICAL_CREAM | Freq: Every day | CUTANEOUS | 1 refills | Status: DC
Start: 2022-08-21 — End: 2023-12-17

## 2022-08-21 NOTE — Progress Notes (Signed)
Established patient visit   Patient: Carl Crawford   DOB: 1960-09-02   62 y.o. Male  MRN: [id number] Visit Date: 08/21/2022  Today's healthcare provider: [name], DO   Chief Complaint  Patient presents with   Diabetes    Patient was seen 2 weeks ago.  Management changes included changing Metformin to Jardiance.  Patient reports good tolerance of the medication with the exception of nocturia x 3.    Hypertension   Subjective    HPI HPI     Diabetes    Additional comments: Patient was seen 2 weeks ago.  Management changes included changing Metformin to Jardiance.  Patient reports good tolerance of the medication with the exception of nocturia x 3.         Comments   Patient appears to be confused about his medications and I am not sure that he is taking the increased hydrochlorothiazide.  He kept saying his BP med was increased from 50-100, which was the Losartan and that was at the visit in June.  I am not sure he is even taking the hydrochlorothiazide.  He does not seem to be aware he is on more than 1 medication for blood pressure.  I have asked him to be sure and bring in all his medication on his next visit.      Last edited by Adline Peals, CMA on 08/21/2022  1:38 PM.      Tdap: doesn't remember if has had recent tetanus Shingrix: has not received   Diabetes: Patient was able to get the Dexcom G7 sensor and successfully wore 1, which his coworker helped him apply.  He has not been checking his sugars in the last week or so though, because he attempted to apply a sensor himself unsuccessfully.  Upon further discussion, it seems he did not understand how to complete the initiation and warm up phase for the sensor.  He brought a sensor with him today and requested assistance in placing it.  He has been working to improve his diet and has reduced the number of [place] fries he has eaten and has increased his intake of chicken and  vegetables.    Medications: Outpatient Medications Prior to Visit  Medication Sig   Blood Glucose Monitoring Suppl DEVI 1 each by Does not apply route daily before breakfast. May substitute to any manufacturer covered by patient's insurance.   Continuous Glucose Receiver (DEXCOM G7 RECEIVER) DEVI Use to check blood sugar as needed for insulin dependent type 2 diabetes   Continuous Glucose Sensor (DEXCOM G7 SENSOR) MISC Use to check blood sugar as needed for insulin dependent type 2 diabetes   esomeprazole (NEXIUM) 20 MG capsule Take 20 mg by mouth daily at 12 noon.   Glucose Blood (BLOOD GLUCOSE TEST STRIPS) STRP 1 each by In Vitro route daily before breakfast. May substitute to any manufacturer covered by patient's insurance.   hydrochlorothiazide (HYDRODIURIL) 25 MG tablet Take 1 tablet (25 mg total) by mouth daily.   losartan (COZAAR) 100 MG tablet Take 1 tablet (100 mg total) by mouth daily.   Multiple Vitamins-Minerals (MENS MULTIVITAMIN PO) Take by mouth.   rosuvastatin (CRESTOR) 20 MG tablet Take 1 tablet (20 mg total) by mouth daily.   triamcinolone cream (KENALOG) 0.1 % Apply 1 application. topically 2 (two) times daily. On areas that itch   [DISCONTINUED] empagliflozin (JARDIANCE) 10 MG TABS tablet Take 1 tablet (10 mg total) by mouth daily. Take one tablet  by mouth daily   [DISCONTINUED] Lancet Device MISC 1 each by Does not apply route daily before breakfast. May substitute to any manufacturer covered by patient's insurance. (Patient not taking: Reported on 08/05/2022)   [DISCONTINUED] Lancets Misc. MISC 1 each by Does not apply route daily before breakfast. May substitute to any manufacturer covered by patient's insurance. (Patient not taking: Reported on 08/05/2022)   No facility-administered medications prior to visit.    Review of Systems  Constitutional:  Negative for appetite change, chills, fatigue, fever and unexpected weight change.  Eyes:  Negative for visual disturbance.   Respiratory: Negative.  Negative for cough, shortness of breath and wheezing.   Cardiovascular:  Negative for chest pain, palpitations and leg swelling.  Endocrine: Negative for polydipsia, polyphagia and polyuria.  Neurological:  Negative for weakness and headaches.         Objective    BP 126/86 (BP Location: Left Arm, Patient Position: Sitting, Cuff Size: Normal)   Pulse 75   Temp 97.9 F (36.6 C) (Oral)   Ht 6\' 1"  (1.854 m)   Wt 204 lb (92.5 kg)   SpO2 98%   BMI 26.91 kg/m      Physical Exam Vitals reviewed.  Constitutional:      General: He is not in acute distress.    Appearance: Normal appearance. He is not diaphoretic.  HENT:     Head: Normocephalic and atraumatic.  Eyes:     General: No scleral icterus.    Conjunctiva/sclera: Conjunctivae normal.  Cardiovascular:     Rate and Rhythm: Normal rate and regular rhythm.     Pulses: Normal pulses.          Dorsalis pedis pulses are 2+ on the right side and 2+ on the left side.       Posterior tibial pulses are 2+ on the right side and 2+ on the left side.     Heart sounds: Normal heart sounds. No murmur heard. Pulmonary:     Effort: Pulmonary effort is normal. No respiratory distress.     Breath sounds: Normal breath sounds. No wheezing or rhonchi.  Musculoskeletal:     Cervical back: Neck supple.     Right lower leg: No edema.     Left lower leg: No edema.  Feet:     Right foot:     Protective Sensation: 10 sites tested.  10 sites sensed.     Skin integrity: Skin breakdown present.     Toenail Condition: Right toenails are normal.     Left foot:     Protective Sensation: 10 sites tested.  9 sites sensed.     Skin integrity: Skin breakdown present.     Toenail Condition: Left toenails are normal.     Comments: Sensation tested in 10 out of 10 sites, with absent sensation at site 9 on the left foot.  All sensation diminished (noted to be dull rather than sharp). Redness and skin breakdown noted  interdigitally on both feet between all toes, with increased moisture present.  Hyperkeratotic areas noted bilaterally in moccasin pattern. Lymphadenopathy:     Cervical: No cervical adenopathy.  Skin:    General: Skin is warm and dry.     Findings: No rash.  Neurological:     Mental Status: He is alert and oriented to person, place, and time. Mental status is at baseline.  Psychiatric:        Mood and Affect: Mood normal.        Behavior:  Behavior normal.      No results found for any visits on 08/21/22.  Assessment & Plan    Type 2 diabetes mellitus with hyperglycemia, without long-term current use of insulin (HCC) Assessment & Plan: Patient is improving his diet and endorses trying to be active.  He has not been checking his glucoses however due to not understanding how to apply his glucose sensor.  He was able to follow his blood glucoses when his coworker did help him apply 1.  Assisted him in placing 1 today and showed him that there is a code on the box for the sensor that he needs to input when initializing a new sensor.  Also advised him that there is a 20-minute warm up period in which he will not receive blood sugar information.   Mixed hyperlipidemia Assessment & Plan: Patient is currently on rosuvastatin started at the beginning of the month.  Will recheck his lipid panel in 2 months.   Primary hypertension Assessment & Plan: Blood pressure fairly well-controlled today.  Will not be making any changes at this point.   Tinea pedis of both feet Assessment & Plan: Interdigital tinea pedis and Hyperkeratotic (moccasin-type) tinea pedis on exam.  Discussed with patient the importance of checking his feet daily and keeping them as clean as possible due to the difficulty his body will having clearing any infection he develops, as well as the increased likelihood of him developing an infection should he sustain an injury.  Prescribed ketoconazole as noted below, with once  daily administration to facilitate ease of following treatment regimen.  Advised patient to not apply lotion to his feet twice daily, as he reports doing, as this increases the likelihood of fungal infection.  Orders: -     Ketoconazole; Apply 1 Application topically daily.  Dispense: 60 g; Refill: 1     Return in about 4 weeks (around 09/18/2022) for DM.      I discussed the assessment and treatment plan with the patient  The patient was provided an opportunity to ask questions and all were answered. The patient agreed with the plan and demonstrated an understanding of the instructions.   The patient was advised to call back or seek an in-person evaluation if the symptoms worsen or if the condition fails to improve as anticipated.    [name], DO  [facility] [phone] (phone) [phone] (fax)  [facility]

## 2022-08-21 NOTE — Patient Instructions (Addendum)
Check with your insurance to see if they will cover the Shingrix (shingles) vaccine  and the Tdap (tetanus)   - also check if they prefer you get it here at the clinic or at the pharmacy.

## 2022-08-23 ENCOUNTER — Other Ambulatory Visit: Payer: Self-pay | Admitting: Family Medicine

## 2022-08-23 ENCOUNTER — Telehealth: Payer: Self-pay | Admitting: Podiatry

## 2022-08-23 DIAGNOSIS — E1169 Type 2 diabetes mellitus with other specified complication: Secondary | ICD-10-CM

## 2022-08-23 MED ORDER — EMPAGLIFLOZIN 10 MG PO TABS
10.0000 mg | ORAL_TABLET | Freq: Every day | ORAL | 1 refills | Status: DC
Start: 2022-08-23 — End: 2023-07-31

## 2022-08-23 NOTE — Telephone Encounter (Signed)
Left vm for opu

## 2022-08-23 NOTE — Telephone Encounter (Signed)
Unable to refill per protocol, Rx request is too soon. Last refill 08/05/22 for 90 and 1 refill.  Requested Prescriptions  Pending Prescriptions Disp Refills   empagliflozin (JARDIANCE) 10 MG TABS tablet 90 tablet 1    Sig: Take 1 tablet (10 mg total) by mouth daily. Take one tablet by mouth daily     Endocrinology:  Diabetes - SGLT2 Inhibitors Passed - 08/23/2022  1:03 PM      Passed - Cr in normal range and within 360 days    Creatinine, Ser  Date Value Ref Range Status  07/11/2022 0.95 0.76 - 1.27 mg/dL Final         Passed - HBA1C is between 0 and 7.9 and within 180 days    Hgb A1c MFr Bld  Date Value Ref Range Status  07/11/2022 7.0 (H) 4.8 - 5.6 % Final    Comment:             Prediabetes: 5.7 - 6.4          Diabetes: >6.4          Glycemic control for adults with diabetes: <7.0          Passed - eGFR in normal range and within 360 days    eGFR  Date Value Ref Range Status  07/11/2022 89 >59 mL/min/1.73 Final         Passed - Valid encounter within last 6 months    Recent Outpatient Visits           2 days ago Type 2 diabetes mellitus with hyperglycemia, without long-term current use of insulin (HCC)   [place] [facility] [place], Monico Blitz, DO   2 weeks ago Mixed diabetic hyperlipidemia associated with type 2 diabetes mellitus [facility])   [place] [facility] [place], Monico Blitz, DO   1 month ago Type 2 diabetes mellitus with hyperglycemia, without long-term current use of insulin [facility])   [place] [facility] [place], Monico Blitz, DO   1 month ago Primary hypertension   [place] [facility] Alfredia Ferguson, PA-C   7 months ago Primary hypertension   [place] [facility] Alfredia Ferguson, PA-C       Future Appointments             In 6 days Deirdre Evener, MD [place] [facility]   In 3 weeks [place], Monico Blitz, DO [place] [facility], [facility]

## 2022-08-23 NOTE — Telephone Encounter (Signed)
Medication Refill - Medication: empagliflozin (JARDIANCE) 10 MG TABS tablet   Pt seen his PCP on Wednesday 08/21/2022 and states that she forgot to call his medication in. Pt took his last pill this morning.    Has the patient contacted their pharmacy? Yes.     Preferred Pharmacy (with phone number or street name): [facility] [phone] [place], [place] - [address] [place], [place] [address] Phone: [phone]  Fax: [phone]   Has the patient been seen for an appointment in the last year OR does the patient have an upcoming appointment? Yes.  Agent: Please be advised that RX refills may take up to 3 business days. We ask that you follow-up with your pharmacy.

## 2022-08-28 DIAGNOSIS — B353 Tinea pedis: Secondary | ICD-10-CM | POA: Insufficient documentation

## 2022-08-28 DIAGNOSIS — E1165 Type 2 diabetes mellitus with hyperglycemia: Secondary | ICD-10-CM | POA: Insufficient documentation

## 2022-08-28 DIAGNOSIS — E782 Mixed hyperlipidemia: Secondary | ICD-10-CM | POA: Insufficient documentation

## 2022-08-28 NOTE — Assessment & Plan Note (Signed)
Patient is currently on rosuvastatin started at the beginning of the month.  Will recheck his lipid panel in 2 months.

## 2022-08-28 NOTE — Assessment & Plan Note (Signed)
Patient is improving his diet and endorses trying to be active.  He has not been checking his glucoses however due to not understanding how to apply his glucose sensor.  He was able to follow his blood glucoses when his coworker did help him apply 1.  Assisted him in placing 1 today and showed him that there is a code on the box for the sensor that he needs to input when initializing a new sensor.  Also advised him that there is a 20-minute warm up period in which he will not receive blood sugar information.

## 2022-08-28 NOTE — Assessment & Plan Note (Addendum)
Interdigital tinea pedis and Hyperkeratotic (moccasin-type) tinea pedis on exam.  Discussed with patient the importance of checking his feet daily and keeping them as clean as possible due to the difficulty his body will having clearing any infection he develops, as well as the increased likelihood of him developing an infection should he sustain an injury.  Prescribed ketoconazole as noted below, with once daily administration to facilitate ease of following treatment regimen.  Advised patient to not apply lotion to his feet twice daily, as he reports doing, as this increases the likelihood of fungal infection.

## 2022-08-28 NOTE — Assessment & Plan Note (Signed)
Blood pressure fairly well-controlled today.  Will not be making any changes at this point.

## 2022-08-29 ENCOUNTER — Emergency Department
Admission: EM | Admit: 2022-08-29 | Discharge: 2022-08-29 | Disposition: A | Discharge status: Home / Self Care | Payer: 59 | Attending: Emergency Medicine | Admitting: Emergency Medicine

## 2022-08-29 ENCOUNTER — Ambulatory Visit: Payer: 59 | Admitting: Dermatology

## 2022-08-29 ENCOUNTER — Encounter: Payer: Self-pay | Admitting: Emergency Medicine

## 2022-08-29 ENCOUNTER — Other Ambulatory Visit: Payer: Self-pay

## 2022-08-29 VITALS — BP 136/88

## 2022-08-29 DIAGNOSIS — I10 Essential (primary) hypertension: Secondary | ICD-10-CM | POA: Diagnosis not present

## 2022-08-29 DIAGNOSIS — L821 Other seborrheic keratosis: Secondary | ICD-10-CM

## 2022-08-29 DIAGNOSIS — M79601 Pain in right arm: Secondary | ICD-10-CM | POA: Diagnosis not present

## 2022-08-29 DIAGNOSIS — E119 Type 2 diabetes mellitus without complications: Secondary | ICD-10-CM | POA: Diagnosis not present

## 2022-08-29 DIAGNOSIS — D492 Neoplasm of unspecified behavior of bone, soft tissue, and skin: Secondary | ICD-10-CM

## 2022-08-29 DIAGNOSIS — L918 Other hypertrophic disorders of the skin: Secondary | ICD-10-CM | POA: Diagnosis not present

## 2022-08-29 DIAGNOSIS — D3617 Benign neoplasm of peripheral nerves and autonomic nervous system of trunk, unspecified: Secondary | ICD-10-CM | POA: Diagnosis not present

## 2022-08-29 DIAGNOSIS — L820 Inflamed seborrheic keratosis: Secondary | ICD-10-CM | POA: Diagnosis not present

## 2022-08-29 HISTORY — DX: Type 2 diabetes mellitus without complications: E11.9

## 2022-08-29 LAB — CBC WITH DIFFERENTIAL/PLATELET
Abs Immature Granulocytes: 0.01 10*3/uL (ref 0.00–0.07)
Basophils Absolute: 0 10*3/uL (ref 0.0–0.1)
Basophils Relative: 1 %
Eosinophils Absolute: 0.1 10*3/uL (ref 0.0–0.5)
Eosinophils Relative: 1 %
HCT: 44.6 % (ref 39.0–52.0)
Hemoglobin: 16.2 g/dL (ref 13.0–17.0)
Immature Granulocytes: 0 %
Lymphocytes Relative: 27 %
Lymphs Abs: 1.5 10*3/uL (ref 0.7–4.0)
MCH: 31.2 pg (ref 26.0–34.0)
MCHC: 36.3 g/dL — ABNORMAL HIGH (ref 30.0–36.0)
MCV: 85.9 fL (ref 80.0–100.0)
Monocytes Absolute: 0.5 10*3/uL (ref 0.1–1.0)
Monocytes Relative: 9 %
Neutro Abs: 3.5 10*3/uL (ref 1.7–7.7)
Neutrophils Relative %: 62 %
Platelets: 262 10*3/uL (ref 150–400)
RBC: 5.19 MIL/uL (ref 4.22–5.81)
RDW: 11.9 % (ref 11.5–15.5)
WBC: 5.6 10*3/uL (ref 4.0–10.5)
nRBC: 0 % (ref 0.0–0.2)

## 2022-08-29 LAB — BASIC METABOLIC PANEL
Anion gap: 10 (ref 5–15)
BUN: 18 mg/dL (ref 8–23)
CO2: 22 mmol/L (ref 22–32)
Calcium: 9.1 mg/dL (ref 8.9–10.3)
Chloride: 103 mmol/L (ref 98–111)
Creatinine, Ser: 0.98 mg/dL (ref 0.61–1.24)
GFR, Estimated: >60 mL/min (ref >60)
Glucose, Bld: 196 mg/dL — ABNORMAL HIGH (ref 70–99)
Potassium: 3.4 mmol/L — ABNORMAL LOW (ref 3.5–5.1)
Sodium: 135 mmol/L (ref 135–145)

## 2022-08-29 NOTE — ED Notes (Signed)
See triage note  Denies any complaints  States he just wants a regular MD

## 2022-08-29 NOTE — Patient Instructions (Addendum)
Wound Care Instructions  Cleanse wound gently with soap and water once a day then pat dry with clean gauze. Apply a thin coat of Petrolatum (petroleum jelly, "Vaseline") over the wound (unless you have an allergy to this). We recommend that you use a new, sterile tube of Vaseline. Do not pick or remove scabs. Do not remove the yellow or white "healing tissue" from the base of the wound.  Cover the wound with fresh, clean, nonstick gauze and secure with paper tape. You may use Band-Aids in place of gauze and tape if the wound is small enough, but would recommend trimming much of the tape off as there is often too much. Sometimes Band-Aids can irritate the skin.  You should call the office for your biopsy report after 1 week if you have not already been contacted.  If you experience any problems, such as abnormal amounts of bleeding, swelling, significant bruising, significant pain, or evidence of infection, please call the office immediately.  FOR ADULT SURGERY PATIENTS: If you need something for pain relief you may take 1 extra strength Tylenol (acetaminophen) AND 2 Ibuprofen (200mg  each) together every 4 hours as needed for pain. (do not take these if you are allergic to them or if you have a reason you should not take them.) Typically, you may only need pain medication for 1 to 3 days.         Cryotherapy Aftercare  Wash gently with soap and water everyday.   Apply Vaseline and Band-Aid daily until healed.     Due to recent changes in healthcare laws, you may see results of your pathology and/or laboratory studies on MyChart before the doctors have had a chance to review them. We understand that in some cases there may be results that are confusing or concerning to you. Please understand that not all results are received at the same time and often the doctors may need to interpret multiple results in order to provide you with the best plan of care or course of treatment. Therefore, we ask  that you please give Carl Crawford 2 business days to thoroughly review all your results before contacting the office for clarification. Should we see a critical lab result, you will be contacted sooner.   If You Need Anything After Your Visit  If you have any questions or concerns for your doctor, please call our main line at [phone] and press option 4 to reach your doctor's medical assistant. If no one answers, please leave a voicemail as directed and we will return your call as soon as possible. Messages left after 4 pm will be answered the following business day.   You may also send Carl Crawford a message via MyChart. We typically respond to MyChart messages within 1-2 business days.  For prescription refills, please ask your pharmacy to contact our office. Our fax number is [phone].  If you have an urgent issue when the clinic is closed that cannot wait until the next business day, you can page your doctor at the number below.    Please note that while we do our best to be available for urgent issues outside of office hours, we are not available 24/7.   If you have an urgent issue and are unable to reach Carl Crawford, you may choose to seek medical care at your doctor's office, retail clinic, urgent care center, or emergency room.  If you have a medical emergency, please immediately call 911 or go to the emergency department.  Pager Numbers  -  Dr. Gwen Pounds: [phone]  - Dr. Roseanne Reno: [phone]  In the event of inclement weather, please call our main line at [phone] for an update on the status of any delays or closures.  Dermatology Medication Tips: Please keep the boxes that topical medications come in in order to help keep track of the instructions about where and how to use these. Pharmacies typically print the medication instructions only on the boxes and not directly on the medication tubes.   If your medication is too expensive, please contact our office at [phone] option 4 or send  Carl Crawford a message through MyChart.   We are unable to tell what your co-pay for medications will be in advance as this is different depending on your insurance coverage. However, we may be able to find a substitute medication at lower cost or fill out paperwork to get insurance to cover a needed medication.   If a prior authorization is required to get your medication covered by your insurance company, please allow Carl Crawford 1-2 business days to complete this process.  Drug prices often vary depending on where the prescription is filled and some pharmacies may offer cheaper prices.  The website www.goodrx.com contains coupons for medications through different pharmacies. The prices here do not account for what the cost may be with help from insurance (it may be cheaper with your insurance), but the website can give you the price if you did not use any insurance.  - You can print the associated coupon and take it with your prescription to the pharmacy.  - You may also stop by our office during regular business hours and pick up a GoodRx coupon card.  - If you need your prescription sent electronically to a different pharmacy, notify our office through [facility] or by phone at [phone] option 4.     Si Usted Necesita Algo Despus de Su Visita  Tambin puede enviarnos un mensaje a travs de Clinical cytogeneticist. Por lo general respondemos a los mensajes de MyChart en el transcurso de 1 a 2 das hbiles.  Para renovar recetas, por favor pida a su farmacia que se ponga en contacto con nuestra oficina. Annie Sable de fax es [place] [phone].  Si tiene un asunto urgente cuando la clnica est cerrada y que no puede esperar hasta el siguiente da hbil, puede llamar/localizar a su doctor(a) al nmero que aparece a continuacin.   Por favor, tenga en cuenta que aunque hacemos todo lo posible para estar disponibles para asuntos urgentes fuera del horario de [place], no estamos disponibles las 24 horas del da,  los 7 [address]  [address] de la [place].   Si tiene un problema urgente y no puede comunicarse con nosotros, puede optar por buscar atencin mdica  en el consultorio de su doctor(a), en una clnica privada, en un centro de atencin urgente o en una sala de emergencias.  Si tiene Engineer, drilling, por favor llame inmediatamente al 911 o vaya a la sala de emergencias.  Nmeros de bper  - Dr. Gwen Pounds: [phone]  - Dra. Roseanne Reno: [phone]  En caso de inclemencias del [place], por favor llame a Lacy Duverney principal al [phone] para una actualizacin sobre el [place] de cualquier retraso o cierre.  Consejos para la medicacin en dermatologa: Por favor, guarde las cajas en las que vienen los medicamentos de uso tpico para ayudarle a seguir las instrucciones sobre dnde y cmo usarlos. Las farmacias generalmente imprimen las instrucciones del medicamento slo en las cajas y no directamente en los tubos  del medicamento.   Si su medicamento es muy caro, por favor, pngase en contacto con Rolm Gala llamando al [phone] y presione la opcin 4 o envenos un mensaje a travs de Clinical cytogeneticist.   No podemos decirle cul ser su copago por los medicamentos por adelantado ya que esto es diferente dependiendo de la cobertura de su seguro. Sin embargo, es posible que podamos encontrar un medicamento sustituto a Audiological scientist un formulario para que el seguro cubra el medicamento que se considera necesario.   Si se requiere una autorizacin previa para que su compaa de seguros [place] su medicamento, por favor permtanos de 1 a 2 das hbiles para completar [address]  Los precios de los medicamentos varan con frecuencia dependiendo del Environmental consultant de dnde se surte la receta y alguna farmacias pueden ofrecer precios ms baratos.  El sitio web www.goodrx.com tiene cupones para medicamentos de Health and safety inspector. Los precios aqu no tienen en cuenta lo que podra costar con la ayuda del seguro  (puede ser ms barato con su seguro), pero el sitio web puede darle el precio si no utiliz Tourist information centre manager.  - Puede imprimir el cupn correspondiente y llevarlo con su receta a la farmacia.  - Tambin puede pasar por nuestra oficina durante el horario de atencin regular y Education officer, museum una tarjeta de cupones de GoodRx.  - Si necesita que su receta se enve electrnicamente a una farmacia diferente, informe a nuestra oficina a travs de MyChart de [place] o por telfono llamando al [phone] y presione la opcin 4.

## 2022-08-29 NOTE — Discharge Instructions (Signed)

## 2022-08-29 NOTE — ED Provider Notes (Signed)
[facility] Provider Note    Event Date/Time   First MD Initiated Contact with Patient 08/29/22 1250     (approximate)   History   Medical Clearance (Just wanting a referral to a family doctor.  Has been seeing another doctor  in [place] for diabetes but feels he needs a primary care./ Carl Crawford complaint, just right arm soreness that has been going on for a while.///)   HPI  Carl Crawford is a 62 y.o. male with history of hypertension, diabetes presents emergency department stating that he has an endocrinologist but needs a PCP.  States he put his glucose monitor on the right arm in the wrong place and it was little painful.  States just been sore but no redness or swelling.  Patient has not called his endocrinologist to see if they could refer him to a PCP      Physical Exam   Triage Vital Signs: ED Triage Vitals  Encounter Vitals Group     BP 08/29/22 1237 (!) 147/80     Systolic BP Percentile --      Diastolic BP Percentile --      Pulse Rate 08/29/22 1237 73     Resp 08/29/22 1237 17     Temp 08/29/22 1237 97.9 F (36.6 C)     Temp Source 08/29/22 1237 Oral     SpO2 08/29/22 1237 97 %     Weight 08/29/22 1214 203 lb 14.8 oz (92.5 kg)     Height 08/29/22 1214 6\' 1"  (1.854 m)     Head Circumference --      Peak Flow --      Pain Score 08/29/22 1214 0     Pain Loc --      Pain Education --      Exclude from Growth Chart --     Most recent vital signs: Vitals:   08/29/22 1237  BP: (!) 147/80  Pulse: 73  Resp: 17  Temp: 97.9 F (36.6 C)  SpO2: 97%     General: Awake, no distress.   CV:  Good peripheral perfusion. regular rate and  rhythm Resp:  Normal effort. Lungs cta Abd:  No distention.   Other:  Right arm slightly tender, no redness pus or swelling noted   ED Results / Procedures / Treatments   Labs (all labs ordered are listed, but only abnormal results are displayed) Labs Reviewed  CBC WITH DIFFERENTIAL/PLATELET -  Abnormal; Notable for the following components:      Result Value   MCHC 36.3 (*)    All other components within normal limits  BASIC METABOLIC PANEL - Abnormal; Notable for the following components:   Potassium 3.4 (*)    Glucose, Bld 196 (*)    All other components within normal limits     EKG     RADIOLOGY     PROCEDURES:   Procedures   MEDICATIONS ORDERED IN ED: Medications - No data to display   IMPRESSION / MDM / ASSESSMENT AND PLAN / ED COURSE  I reviewed the triage vital signs and the nursing notes.                              Differential diagnosis includes, but is not limited to, diabetes, contusion, referral  Patient's presentation is most consistent with acute, uncomplicated illness.   Labs are reassuring  Did discuss the labs with the patient.  He  was given a list of PCP options.  A referral was put in for PCP.  He should also call his endocrinologist to see if she prefers a PCP within her group.  States he understands.  Discharged stable condition.      FINAL CLINICAL IMPRESSION(S) / ED DIAGNOSES   Final diagnoses:  Pain of right upper extremity     Rx / DC Orders   ED Discharge Orders          Ordered    Ambulatory Referral to Primary Care (Establish Care)       Comments: Pcp needs, has endocrinologist   08/29/22 1331             Note:  This document was prepared using Dragon voice recognition software and may include unintentional dictation errors.    Faythe Ghee, PA-C 08/29/22 1335    Merwyn Katos, MD 08/30/22 [phone]

## 2022-08-29 NOTE — ED Triage Notes (Signed)
Just wanting a referral to a family doctor.  Has been seeing another doctor  in [place] for diabetes but feels he needs a primary care.  Denies complaint, just right arm soreness that has been going on for a while.

## 2022-08-29 NOTE — Progress Notes (Signed)
   Follow-Up Visit   Subjective  Carl Crawford is a 62 y.o. male who presents for the following: growths, irritating, back, axilla, neck The patient has spots, moles and lesions to be evaluated, some may be new or changing and the patient may have concern these could be cancer.  The following portions of the chart were reviewed this encounter and updated as appropriate: medications, allergies, medical history  Review of Systems:  No other skin or systemic complaints except as noted in HPI or Assessment and Plan.  Objective  Well appearing patient in no apparent distress; mood and affect are within normal limits. A focused examination was performed of the following areas: Neck, axilla, back  Relevant exam findings are noted in the Assessment and Plan.  Low back spinal 0.7cm flesh pap       back x 15 (15) Stuck on waxy paps with erythema   Assessment & Plan   Acrochordons (Skin Tags) - Fleshy, skin-colored pedunculated papules - Benign appearing.  - Observe. - If desired, they can be removed with an in office procedure that is not covered by insurance. - Please call the clinic if you notice any new or changing lesions.  - axilla, discussed cosmetic removal $115 for up to 15  Neoplasm of skin Low back spinal  Epidermal / dermal shaving  Lesion diameter (cm):  0.7 Informed consent: discussed and consent obtained   Timeout: patient name, date of birth, surgical site, and procedure verified   Procedure prep:  Patient was prepped and draped in usual sterile fashion Prep type:  Isopropyl alcohol Anesthesia: the lesion was anesthetized in a standard fashion   Anesthetic:  1% lidocaine w/ epinephrine 1-100,000 buffered w/ 8.4% NaHCO3 Instrument used: flexible razor blade   Hemostasis achieved with: pressure, aluminum chloride and electrodesiccation   Outcome: patient tolerated procedure well   Post-procedure details: sterile dressing applied and wound care instructions  given   Dressing type: bandage and petrolatum    Specimen 1 - Surgical pathology Differential Diagnosis: D48.5 Irritated Nevus r/o Atypia  Check Margins: yes 0.7cm flesh pap  Inflamed seborrheic keratosis (15) back x 15  Symptomatic, irritating, patient would like treated.   Destruction of lesion - back x 15 (15) Complexity: simple   Destruction method: cryotherapy   Informed consent: discussed and consent obtained   Timeout:  patient name, date of birth, surgical site, and procedure verified Lesion destroyed using liquid nitrogen: Yes   Region frozen until ice ball extended beyond lesion: Yes   Outcome: patient tolerated procedure well with no complications   Post-procedure details: wound care instructions given    SEBORRHEIC KERATOSIS - Stuck-on, waxy, tan-brown papules and/or plaques  - Benign-appearing - Discussed benign etiology and prognosis. - Observe - Call for any changes  Return in about 6 months (around 03/01/2023) for ISK/skin tags.  I, Ardis Rowan, RMA, am acting as scribe for Armida Sans, MD .  Documentation: I have reviewed the above documentation for accuracy and completeness, and I agree with the above.  Armida Sans, MD

## 2022-09-03 ENCOUNTER — Encounter: Payer: Self-pay | Admitting: Dermatology

## 2022-09-07 ENCOUNTER — Other Ambulatory Visit: Payer: Self-pay | Admitting: Family Medicine

## 2022-09-07 DIAGNOSIS — E1169 Type 2 diabetes mellitus with other specified complication: Secondary | ICD-10-CM

## 2022-09-09 ENCOUNTER — Other Ambulatory Visit: Payer: Self-pay | Admitting: Family Medicine

## 2022-09-09 DIAGNOSIS — I10 Essential (primary) hypertension: Secondary | ICD-10-CM

## 2022-09-09 NOTE — Telephone Encounter (Signed)
Requested medications are due for refill today.  unsure  Requested medications are on the active medications list.  yes  Last refill. 08/05/2022 1 0 rf  Future visit scheduled.   yes  Notes to clinic.  Unsure when rf would be due.     Requested Prescriptions  Pending Prescriptions Disp Refills   Continuous Glucose Receiver (DEXCOM G7 RECEIVER) DEVI [Pharmacy Med Name: DEXCOM G7 RECEIVER] 1 each 0    Sig: Use to check blood sugar as needed for insulin dependent type 2 diabetes     Endocrinology: Diabetes - Testing Supplies Passed - 09/07/2022 11:19 AM      Passed - Valid encounter within last 12 months    Recent Outpatient Visits           2 weeks ago Type 2 diabetes mellitus with hyperglycemia, without long-term current use of insulin (HCC)   [place] [facility] Pardue, Monico Blitz, DO   1 month ago Mixed diabetic hyperlipidemia associated with type 2 diabetes mellitus [facility])   [place] [facility], Monico Blitz, DO   1 month ago Type 2 diabetes mellitus with hyperglycemia, without long-term current use of insulin [facility])   [place] [facility] [place], Monico Blitz, DO   2 months ago Primary hypertension   [place] [facility] Alfredia Ferguson, PA-C   8 months ago Primary hypertension   [place] [facility] Alfredia Ferguson, PA-C       Future Appointments             In 1 week Pardue, Monico Blitz, DO [place] [facility]   In 6 months Deirdre Evener, MD [facility]

## 2022-09-10 ENCOUNTER — Telehealth: Payer: Self-pay

## 2022-09-10 NOTE — Telephone Encounter (Signed)
Requested Prescriptions  Pending Prescriptions Disp Refills   hydrochlorothiazide (HYDRODIURIL) 25 MG tablet [Pharmacy Med Name: HYDROCHLOROTHIAZIDE 25 MG TAB] 90 tablet 0    Sig: TAKE 1 TABLET (25 MG TOTAL) BY MOUTH DAILY.     Cardiovascular: Diuretics - Thiazide Failed - 09/09/2022  2:25 AM      Failed - K in normal range and within 180 days    Potassium  Date Value Ref Range Status  08/29/2022 3.4 (L) 3.5 - 5.1 mmol/L Final         Passed - Cr in normal range and within 180 days    Creatinine, Ser  Date Value Ref Range Status  08/29/2022 0.98 0.61 - 1.24 mg/dL Final         Passed - Na in normal range and within 180 days    Sodium  Date Value Ref Range Status  08/29/2022 135 135 - 145 mmol/L Final  07/11/2022 141 134 - 144 mmol/L Final         Passed - Last BP in normal range    BP Readings from Last 1 Encounters:  08/29/22 136/88         Passed - Valid encounter within last 6 months    Recent Outpatient Visits           2 weeks ago Type 2 diabetes mellitus with hyperglycemia, without long-term current use of insulin (HCC)   [place] [facility] Pardue, Monico Blitz, DO   1 month ago Mixed diabetic hyperlipidemia associated with type 2 diabetes mellitus [facility])   [place] [facility] Pardue, Monico Blitz, DO   1 month ago Type 2 diabetes mellitus with hyperglycemia, without long-term current use of insulin [facility])   [place] [facility] [place], Monico Blitz, DO   2 months ago Primary hypertension   [place] [facility] Alfredia Ferguson, PA-C   8 months ago Primary hypertension   [place] [facility] Alfredia Ferguson, PA-C       Future Appointments             In 1 week Pardue, Monico Blitz, DO [place] [facility], [facility]   In 6 months Deirdre Evener, MD [facility]

## 2022-09-10 NOTE — Telephone Encounter (Signed)
-----   Message from Armida Sans sent at 09/05/2022  5:20 PM EDT ----- Diagnosis Skin , low back spinal NEUROFIBROMA, IRRITATED  Benign irritated neurofibroma No further treatment needed

## 2022-09-10 NOTE — Telephone Encounter (Signed)
Called patient. N/A. LMOVM to C/B 

## 2022-09-12 ENCOUNTER — Telehealth: Payer: Self-pay

## 2022-09-12 NOTE — Telephone Encounter (Signed)
-----   Message from Armida Sans sent at 09/05/2022  5:20 PM EDT ----- Diagnosis Skin , low back spinal NEUROFIBROMA, IRRITATED  Benign irritated neurofibroma No further treatment needed

## 2022-09-12 NOTE — Telephone Encounter (Signed)
Left voicemail to return my call

## 2022-09-13 ENCOUNTER — Other Ambulatory Visit: Payer: Self-pay | Admitting: Family Medicine

## 2022-09-13 DIAGNOSIS — I10 Essential (primary) hypertension: Secondary | ICD-10-CM

## 2022-09-13 DIAGNOSIS — E1165 Type 2 diabetes mellitus with hyperglycemia: Secondary | ICD-10-CM

## 2022-09-17 ENCOUNTER — Telehealth: Payer: Self-pay

## 2022-09-17 NOTE — Telephone Encounter (Signed)
-----   Message from Armida Sans sent at 09/05/2022  5:20 PM EDT ----- Diagnosis Skin , low back spinal NEUROFIBROMA, IRRITATED  Benign irritated neurofibroma No further treatment needed

## 2022-09-17 NOTE — Telephone Encounter (Signed)
Left voicemail. Letter sent with pathology results.

## 2022-09-18 ENCOUNTER — Ambulatory Visit: Payer: 59 | Admitting: Family Medicine

## 2022-09-26 ENCOUNTER — Telehealth: Payer: Self-pay

## 2022-09-26 NOTE — Telephone Encounter (Signed)
Patient called office after receiving letter in the mail and advised BX results showed benign.

## 2022-10-03 ENCOUNTER — Ambulatory Visit: Payer: 59 | Admitting: Family Medicine

## 2022-11-12 ENCOUNTER — Other Ambulatory Visit: Payer: Self-pay | Admitting: Family Medicine

## 2022-11-12 DIAGNOSIS — I10 Essential (primary) hypertension: Secondary | ICD-10-CM

## 2022-12-11 ENCOUNTER — Other Ambulatory Visit: Payer: Self-pay | Admitting: Family Medicine

## 2022-12-11 DIAGNOSIS — I10 Essential (primary) hypertension: Secondary | ICD-10-CM

## 2022-12-12 NOTE — Telephone Encounter (Signed)
Requested medication (s) are due for refill today:yes  Requested medication (s) are on the active medication list:yes  Last refill:  09/10/22 #90  Future visit scheduled: no  Notes to clinic:  pt was to f/u in August -   Requested Prescriptions  Pending Prescriptions Disp Refills   hydrochlorothiazide (HYDRODIURIL) 25 MG tablet [Pharmacy Med Name: HYDROCHLOROTHIAZIDE 25 MG TAB] 30 tablet 2    Sig: TAKE 1 TABLET (25 MG TOTAL) BY MOUTH DAILY.     Cardiovascular: Diuretics - Thiazide Failed - 12/11/2022  2:27 AM      Failed - K in normal range and within 180 days    Potassium  Date Value Ref Range Status  08/29/2022 3.4 (L) 3.5 - 5.1 mmol/L Final         Passed - Cr in normal range and within 180 days    Creatinine, Ser  Date Value Ref Range Status  08/29/2022 0.98 0.61 - 1.24 mg/dL Final         Passed - Na in normal range and within 180 days    Sodium  Date Value Ref Range Status  08/29/2022 135 135 - 145 mmol/L Final  07/11/2022 141 134 - 144 mmol/L Final         Passed - Last BP in normal range    BP Readings from Last 1 Encounters:  08/29/22 136/88         Passed - Valid encounter within last 6 months    Recent Outpatient Visits           3 months ago Type 2 diabetes mellitus with hyperglycemia, without long-term current use of insulin (HCC)   [place] [facility] P[facility]ue, Monico Blitz, DO   4 months ago Mixed diabetic hyperlipidemia associated with type 2 diabetes mellitus [facility])   [place] [facility] P[facility]ue, Monico Blitz, DO   5 months ago Type 2 diabetes mellitus with hyperglycemia, without long-term current use of insulin [facility])   [facility] Pardue, Monico Blitz, DO   5 months ago Primary hypertension   [place] Alfredia Ferguson, PA-C   11 months ago Primary hypertension   [place] [facility] Alfredia Ferguson, PA-C       Future Appointments             In 3  months Deirdre Evener, MD [facility]

## 2023-01-08 ENCOUNTER — Other Ambulatory Visit: Payer: Self-pay | Admitting: Family Medicine

## 2023-01-08 DIAGNOSIS — I10 Essential (primary) hypertension: Secondary | ICD-10-CM

## 2023-02-12 ENCOUNTER — Other Ambulatory Visit: Payer: Self-pay | Admitting: Family Medicine

## 2023-02-12 DIAGNOSIS — I10 Essential (primary) hypertension: Secondary | ICD-10-CM

## 2023-03-12 ENCOUNTER — Other Ambulatory Visit: Payer: Self-pay | Admitting: Family Medicine

## 2023-03-12 DIAGNOSIS — I10 Essential (primary) hypertension: Secondary | ICD-10-CM

## 2023-03-12 NOTE — Telephone Encounter (Signed)
Requested Prescriptions  Pending Prescriptions Disp Refills   hydrochlorothiazide (HYDRODIURIL) 25 MG tablet [Pharmacy Med Name: HYDROCHLOROTHIAZIDE 25 MG TAB] 30 tablet 2    Sig: TAKE 1 TABLET (25 MG TOTAL) BY MOUTH DAILY.     Cardiovascular: Diuretics - Thiazide Failed - 03/12/2023  3:34 PM      Failed - Cr in normal range and within 180 days    Creatinine, Ser  Date Value Ref Range Status  08/29/2022 0.98 0.61 - 1.24 mg/dL Final         Failed - K in normal range and within 180 days    Potassium  Date Value Ref Range Status  08/29/2022 3.4 (L) 3.5 - 5.1 mmol/L Final         Failed - Na in normal range and within 180 days    Sodium  Date Value Ref Range Status  08/29/2022 135 135 - 145 mmol/L Final  07/11/2022 141 134 - 144 mmol/L Final         Failed - Valid encounter within last 6 months    Recent Outpatient Visits           6 months ago Type 2 diabetes mellitus with hyperglycemia, without long-term current use of insulin (HCC)   [place] [facility] P[facility], Monico Blitz, DO   7 months ago Mixed diabetic hyperlipidemia associated with type 2 diabetes mellitus (HCC)   [place] [facility] P[facility]ue, Monico Blitz, DO   8 months ago Type 2 diabetes mellitus with hyperglycemia, without long-term current use of insulin (HCC)   [place] [facility] [place], Monico Blitz, DO   8 months ago Primary hypertension   [place] [facility] Alfredia Ferguson, PA-C   1 year ago Primary hypertension   [place] [facility] [place], Lillia Abed, PA-C              Passed - Last BP in normal range    BP Readings from Last 1 Encounters:  08/29/22 136/88          losartan (COZAAR) 100 MG tablet [Pharmacy Med Name: LOSARTAN POTASSIUM 100 MG TAB] 30 tablet 0    Sig: TAKE 1 TABLET BY MOUTH EVERY DAY     Cardiovascular:  Angiotensin Receptor Blockers Failed - 03/12/2023  3:34 PM      Failed - Cr in normal range and within  180 days    Creatinine, Ser  Date Value Ref Range Status  08/29/2022 0.98 0.61 - 1.24 mg/dL Final         Failed - K in normal range and within 180 days    Potassium  Date Value Ref Range Status  08/29/2022 3.4 (L) 3.5 - 5.1 mmol/L Final         Failed - Valid encounter within last 6 months    Recent Outpatient Visits           6 months ago Type 2 diabetes mellitus with hyperglycemia, without long-term current use of insulin [facility])   [place] [facility] P[place]ue, Monico Blitz, DO   7 months ago Mixed diabetic hyperlipidemia associated with type 2 diabetes mellitus [facility])   [place] [facility] P[place]ue, Monico Blitz, DO   8 months ago Type 2 diabetes mellitus with hyperglycemia, without long-term current use of insulin [facility])   [facility] Pardue, Monico Blitz, DO   8 months ago Primary hypertension   [facility] Alfredia [place], [place]  1 year ago Primary hypertension   [place] [facility] Alfredia Ferguson, [place]              Passed - Patient is not pregnant      Passed - Last BP in normal range    BP Readings from Last 1 Encounters:  08/29/22 136/88

## 2023-03-12 NOTE — Telephone Encounter (Signed)
Needs appointment

## 2023-03-13 ENCOUNTER — Ambulatory Visit: Payer: 59 | Admitting: Dermatology

## 2023-04-09 ENCOUNTER — Other Ambulatory Visit: Payer: Self-pay | Admitting: Family Medicine

## 2023-04-09 DIAGNOSIS — I10 Essential (primary) hypertension: Secondary | ICD-10-CM

## 2023-04-09 NOTE — Telephone Encounter (Signed)
 Requested medication (s) are due for refill today: yes  Requested medication (s) are on the active medication list: yes  Last refill:  03/12/23 #30  Future visit scheduled: no  Notes to clinic:  called pt, no answer, and unable to LM - needs appt already got courtesy refill   Requested Prescriptions  Pending Prescriptions Disp Refills   losartan (COZAAR) 100 MG tablet [Pharmacy Med Name: LOSARTAN POTASSIUM 100 MG TAB] 30 tablet 0    Sig: TAKE 1 TABLET BY MOUTH EVERY DAY     Cardiovascular:  Angiotensin Receptor Blockers Failed - 04/09/2023  3:17 PM      Failed - Cr in normal range and within 180 days    Creatinine, Ser  Date Value Ref Range Status  08/29/2022 0.98 0.61 - 1.24 mg/dL Final         Failed - K in normal range and within 180 days    Potassium  Date Value Ref Range Status  08/29/2022 3.4 (L) 3.5 - 5.1 mmol/L Final         Failed - Valid encounter within last 6 months    Recent Outpatient Visits           7 months ago Type 2 diabetes mellitus with hyperglycemia, without long-term current use of insulin (HCC)   [facility] P[facility], Monico Blitz, DO   8 months ago Mixed diabetic hyperlipidemia associated with type 2 diabetes mellitus [facility])   [facility] P[facility]ue, Monico Blitz, DO   8 months ago Type 2 diabetes mellitus with hyperglycemia, without long-term current use of insulin [employer])   [place] [facility] [place], Monico Blitz, DO   9 months ago Primary hypertension   [place] [facility] Alfredia Ferguson, PA-C   1 year ago Primary hypertension   [place] [facility] [place], [place]              Passed - Patient is not pregnant      Passed - Last BP in normal range    BP Readings from Last 1 Encounters:  08/29/22 136/88

## 2023-04-17 ENCOUNTER — Other Ambulatory Visit: Payer: Self-pay | Admitting: Family Medicine

## 2023-04-17 DIAGNOSIS — I10 Essential (primary) hypertension: Secondary | ICD-10-CM

## 2023-04-23 ENCOUNTER — Other Ambulatory Visit: Payer: Self-pay | Admitting: Family Medicine

## 2023-04-23 DIAGNOSIS — I10 Essential (primary) hypertension: Secondary | ICD-10-CM

## 2023-04-23 NOTE — Telephone Encounter (Signed)
 Copied from CRM [id number]. Topic: Clinical - Medication Refill >> Apr 23, 2023  4:06 PM Antwanette L wrote: Most Recent Primary Care Visit:  Provider: [name]  Department: [id number]  Visit Type: OFFICE VISIT  Date: 08/21/2022  Medication: losartan (COZAAR) 100 MG tablet  Has the patient contacted their pharmacy? Yes. Pharmacy told the patient told contact provider  Is this the correct pharmacy for this prescription? Yes   [id number] [place], [place] - [address] [place] [address] Phone: [phone] Fax: [phone]   Has the prescription been filled recently? No. Last refilled on 03/12/23  Is the patient out of the medication? Yes  Has the patient been seen for an appointment in the last year OR does the patient have an upcoming appointment? Yes   Can we respond through MyChart? No. Contact patient  by phone at [phone]  Agent: Please be advised that Rx refills may take up to 3 business days. We ask that you follow-up with your pharmacy.

## 2023-04-25 NOTE — Telephone Encounter (Signed)
 Requested medications are due for refill today.  yes  Requested medications are on the active medications list.  yes  Last refill. 03/12/2023 #30 0 rf  Future visit scheduled.   no  Notes to clinic.  Pt is overdue for OV. Pt already given a courtesy refill.    Requested Prescriptions  Pending Prescriptions Disp Refills   losartan (COZAAR) 100 MG tablet [Pharmacy Med Name: LOSARTAN POTASSIUM 100 MG TAB] 30 tablet 0    Sig: TAKE 1 TABLET BY MOUTH EVERY DAY     Cardiovascular:  Angiotensin Receptor Blockers Failed - 04/25/2023  8:40 AM      Failed - Cr in normal range and within 180 days    Creatinine, Ser  Date Value Ref Range Status  08/29/2022 0.98 0.61 - 1.24 mg/dL Final         Failed - K in normal range and within 180 days    Potassium  Date Value Ref Range Status  08/29/2022 3.4 (L) 3.5 - 5.1 mmol/L Final         Failed - Valid encounter within last 6 months    Recent Outpatient Visits   None            Passed - Patient is not pregnant      Passed - Last BP in normal range    BP Readings from Last 1 Encounters:  08/29/22 136/88

## 2023-05-01 ENCOUNTER — Other Ambulatory Visit: Payer: Self-pay | Admitting: Family Medicine

## 2023-05-01 DIAGNOSIS — I10 Essential (primary) hypertension: Secondary | ICD-10-CM

## 2023-05-02 NOTE — Telephone Encounter (Signed)
 Requested medications are due for refill today.  yes  Requested medications are on the active medications list.  yes  Last refill. 03/12/2023 #30 0 rf  Future visit scheduled.   no  Notes to clinic.  Pt is more than 3 months overdue for an OV. Pt already given a courtesy refill.    Requested Prescriptions  Pending Prescriptions Disp Refills   losartan (COZAAR) 100 MG tablet [Pharmacy Med Name: LOSARTAN POTASSIUM 100 MG TAB] 30 tablet 0    Sig: TAKE 1 TABLET BY MOUTH EVERY DAY     Cardiovascular:  Angiotensin Receptor Blockers Failed - 05/02/2023 12:56 PM      Failed - Cr in normal range and within 180 days    Creatinine, Ser  Date Value Ref Range Status  08/29/2022 0.98 0.61 - 1.24 mg/dL Final         Failed - K in normal range and within 180 days    Potassium  Date Value Ref Range Status  08/29/2022 3.4 (L) 3.5 - 5.1 mmol/L Final         Failed - Valid encounter within last 6 months    Recent Outpatient Visits   None            Passed - Patient is not pregnant      Passed - Last BP in normal range    BP Readings from Last 1 Encounters:  08/29/22 136/88

## 2023-05-14 ENCOUNTER — Emergency Department: Payer: Self-pay

## 2023-05-14 ENCOUNTER — Other Ambulatory Visit: Payer: Self-pay

## 2023-05-14 ENCOUNTER — Emergency Department: Admit: 2023-05-14 | Payer: Self-pay | Source: Home / Self Care

## 2023-05-14 ENCOUNTER — Emergency Department
Admission: EM | Admit: 2023-05-14 | Discharge: 2023-05-14 | Disposition: A | Discharge status: Home / Self Care | Payer: Self-pay | Attending: Emergency Medicine | Admitting: Emergency Medicine

## 2023-05-14 DIAGNOSIS — I10 Essential (primary) hypertension: Secondary | ICD-10-CM | POA: Insufficient documentation

## 2023-05-14 DIAGNOSIS — R062 Wheezing: Secondary | ICD-10-CM

## 2023-05-14 DIAGNOSIS — E119 Type 2 diabetes mellitus without complications: Secondary | ICD-10-CM | POA: Insufficient documentation

## 2023-05-14 DIAGNOSIS — J101 Influenza due to other identified influenza virus with other respiratory manifestations: Secondary | ICD-10-CM | POA: Insufficient documentation

## 2023-05-14 DIAGNOSIS — R051 Acute cough: Secondary | ICD-10-CM

## 2023-05-14 LAB — RESP PANEL BY RT-PCR (RSV, FLU A&B, COVID)  RVPGX2
Influenza A by PCR: POSITIVE — AB
Influenza B by PCR: NEGATIVE
Resp Syncytial Virus by PCR: NEGATIVE
SARS Coronavirus 2 by RT PCR: NEGATIVE

## 2023-05-14 MED ORDER — AEROCHAMBER MV MISC: 0 refills | Status: AC

## 2023-05-14 MED ORDER — IPRATROPIUM-ALBUTEROL 0.5-2.5 (3) MG/3ML IN SOLN
3.0000 mL | Freq: Once | RESPIRATORY_TRACT | Status: AC
  Administered 2023-05-14: 3 mL via RESPIRATORY_TRACT
  Filled 2023-05-14: qty 3

## 2023-05-14 MED ORDER — OSELTAMIVIR PHOSPHATE 75 MG PO CAPS: 75.0000 mg | ORAL_CAPSULE | Freq: Two times a day (BID) | ORAL | 0 refills | Status: AC

## 2023-05-14 MED ORDER — ALBUTEROL SULFATE HFA 108 (90 BASE) MCG/ACT IN AERS: 2.0000 | INHALATION_SPRAY | Freq: Four times a day (QID) | RESPIRATORY_TRACT | 2 refills | Status: AC | PRN

## 2023-05-14 NOTE — ED Provider Notes (Addendum)
 [facility] Provider Note    Event Date/Time   First MD Initiated Contact with Patient 05/14/23 0105     (approximate)   History   Cough   HPI  Carl Crawford is a 63 y.o. male   Past medical history of diabetes and hypertension presents to the Emergency Department with a cough for 4 days.  It is a dry cough.  He has no shortness of breath or chest pain.  His subjective fevers.  No GI or GU complaints.  Non-smoker.  No history of asthma.  He decided to come to the emergency department tonight because 4 days of symptoms he tried to wait it out but since no improvement he decided to get checked out.  He does not have a primary doctor.   External Medical Documents Reviewed: Clinical support notes from January documenting his social issues including insurance coverage problems hindering his connection to healthcare      Physical Exam   Triage Vital Signs: ED Triage Vitals [05/14/23 0106]  Encounter Vitals Group     BP      Systolic BP Percentile      Diastolic BP Percentile      Pulse      Resp      Temp      Temp src      SpO2      Weight 212 lb (96.2 kg)     Height 6\' 1"  (1.854 m)     Head Circumference      Peak Flow      Pain Score 0     Pain Loc      Pain Education      Exclude from Growth Chart     Most recent vital signs: Vitals:   05/14/23 0109 05/14/23 0127  BP:  (!) 168/85  Pulse: 76   Resp: 16   Temp: 98.1 F (36.7 C)   SpO2: 98%     General: Awake, no distress.  CV:  Good peripheral perfusion.  Resp:  Normal effort.  Abd:  No distention.  Other:  Awake alert comfortable appearing in no acute distress.  No fever, no hypoxemia no respiratory distress pleasant nontoxic-appearing.  Very scant wheeze at end expiration but no focality on lung exam.  Skin appears warm well-perfused, euvolemic.   ED Results / Procedures / Treatments   Labs (all labs ordered are listed, but only abnormal results are displayed) Labs  Reviewed  RESP PANEL BY RT-PCR (RSV, FLU A&B, COVID)  RVPGX2 - Abnormal; Notable for the following components:      Result Value   Influenza A by PCR POSITIVE (*)    All other components within normal limits     I ordered and reviewed the above labs they are notable for positive for influenza A     RADIOLOGY I independently reviewed and interpreted chest x-ray and I see no obvious focality pneumothorax I also reviewed radiologist's formal read.   PROCEDURES:  Critical Care performed: No  Procedures   MEDICATIONS ORDERED IN ED: Medications  ipratropium-albuterol (DUONEB) 0.5-2.5 (3) MG/3ML nebulizer solution 3 mL (3 mLs Nebulization Given 05/14/23 0128)     IMPRESSION / MDM / ASSESSMENT AND PLAN / ED COURSE  I reviewed the triage vital signs and the nursing notes.                                Patient's presentation  is most consistent with acute complicated illness / injury requiring diagnostic workup.  Differential diagnosis includes, but is not limited to, bronchitis, asthma or COPD exacerbation, reactive airways, respiratory infection, considered but less likely sepsis, PE, ACS  MDM: He has had a few days of a cough and has some scant wheezing.  I will try DuoNeb.  Check x-ray for any focal consolidations representing pneumonia.  Check viral testing.  He appears well with normal vital signs I doubt sepsis.  He has no chest pain or shortness of breath I doubt ACS or PE.  I will refer him to PMD and also give him resources for PMD in the area.  Otherwise I believe he looks well enough that he can be treated as an outpatient to have follow-up visit as outpatient.    He ended up testing positive for influenza A.  He has 4 days of symptoms but given his age and comorbidities I started him on Tamiflu prescription.  He looks well enough for discharge and I will refer him to a primary doctor for follow-up  FINAL CLINICAL IMPRESSION(S) / ED DIAGNOSES   Final diagnoses:   Acute cough  Wheeze  Influenza A     Rx / DC Orders   ED Discharge Orders          Ordered    albuterol (VENTOLIN HFA) 108 (90 Base) MCG/ACT inhaler  Every 6 hours PRN        05/14/23 0118    Spacer/Aero-Holding Chambers (AEROCHAMBER MV) inhaler        05/14/23 0118    Ambulatory Referral to Primary Care (Establish Care)        05/14/23 0119    oseltamivir (TAMIFLU) 75 MG capsule  2 times daily        05/14/23 0221             Note:  This document was prepared using Dragon voice recognition software and may include unintentional dictation errors.    Buell Carmin, MD 05/14/23 Olegario Berlin, MD 05/14/23 Madge Schiller

## 2023-05-14 NOTE — ED Triage Notes (Signed)
 Pt to ED via POV c/o cough x4days. Denies any contact with anyone sick. Denies cp, shob, dizziness, fevers

## 2023-05-14 NOTE — Discharge Instructions (Addendum)
 Take Tamiflu for 5 days as prescribed. Take acetaminophen 650 mg and ibuprofen 400 mg every 6 hours for pain.  Take with food. Use the inhaler as prescribed for shortness of breath or wheezing.  Thank you for choosing us  for your health care today!  Please see your primary doctor this week for a follow up appointment.   If you have any new, worsening, or unexpected symptoms call your doctor right away or come back to the emergency department for reevaluation.  It was my pleasure to care for you today.   Arron Large Margery Sheets, MD

## 2023-06-07 ENCOUNTER — Other Ambulatory Visit: Payer: Self-pay | Admitting: Family Medicine

## 2023-06-07 DIAGNOSIS — I10 Essential (primary) hypertension: Secondary | ICD-10-CM

## 2023-06-08 ENCOUNTER — Other Ambulatory Visit: Payer: Self-pay | Admitting: Family Medicine

## 2023-06-08 DIAGNOSIS — I10 Essential (primary) hypertension: Secondary | ICD-10-CM

## 2023-06-09 NOTE — Telephone Encounter (Signed)
 Requested medications are due for refill today.  yes  Requested medications are on the active medications list.  yes  Last refill. 05/02/2023 #30 0 rf  Future visit scheduled.   no  Notes to clinic.  Pt is more than 3 months overdue for an ov. Courtesy refill already given.    Requested Prescriptions  Pending Prescriptions Disp Refills   losartan  (COZAAR ) 100 MG tablet [Pharmacy Med Name: LOSARTAN  POTASSIUM 100 MG TAB] 30 tablet 0    Sig: TAKE 1 TABLET BY MOUTH EVERY DAY     Cardiovascular:  Angiotensin Receptor Blockers Failed - 06/09/2023  5:42 PM      Failed - Cr in normal range and within 180 days    Creatinine, Ser  Date Value Ref Range Status  08/29/2022 0.98 0.61 - 1.24 mg/dL Final         Failed - K in normal range and within 180 days    Potassium  Date Value Ref Range Status  08/29/2022 3.4 (L) 3.5 - 5.1 mmol/L Final         Failed - Last BP in normal range    BP Readings from Last 1 Encounters:  05/14/23 (!) 149/80         Failed - Valid encounter within last 6 months    Recent Outpatient Visits   None            Passed - Patient is not pregnant

## 2023-06-10 ENCOUNTER — Other Ambulatory Visit: Payer: Self-pay | Admitting: Family Medicine

## 2023-06-10 DIAGNOSIS — I10 Essential (primary) hypertension: Secondary | ICD-10-CM

## 2023-07-08 ENCOUNTER — Other Ambulatory Visit: Payer: Self-pay | Admitting: Family Medicine

## 2023-07-08 DIAGNOSIS — I10 Essential (primary) hypertension: Secondary | ICD-10-CM

## 2023-07-11 ENCOUNTER — Other Ambulatory Visit: Payer: Self-pay | Admitting: Family Medicine

## 2023-07-11 DIAGNOSIS — E1169 Type 2 diabetes mellitus with other specified complication: Secondary | ICD-10-CM

## 2023-07-17 ENCOUNTER — Emergency Department

## 2023-07-17 ENCOUNTER — Emergency Department: Admission: EM | Admit: 2023-07-17 | Discharge: 2023-07-17 | Disposition: A | Discharge status: Home / Self Care

## 2023-07-17 ENCOUNTER — Encounter: Payer: Self-pay | Admitting: Emergency Medicine

## 2023-07-17 ENCOUNTER — Other Ambulatory Visit: Payer: Self-pay

## 2023-07-17 ENCOUNTER — Ambulatory Visit: Payer: Self-pay

## 2023-07-17 DIAGNOSIS — E119 Type 2 diabetes mellitus without complications: Secondary | ICD-10-CM | POA: Diagnosis not present

## 2023-07-17 DIAGNOSIS — I824Z1 Acute embolism and thrombosis of unspecified deep veins of right distal lower extremity: Secondary | ICD-10-CM | POA: Diagnosis not present

## 2023-07-17 DIAGNOSIS — I82811 Embolism and thrombosis of superficial veins of right lower extremities: Secondary | ICD-10-CM | POA: Diagnosis not present

## 2023-07-17 DIAGNOSIS — M7989 Other specified soft tissue disorders: Secondary | ICD-10-CM | POA: Diagnosis not present

## 2023-07-17 DIAGNOSIS — I82401 Acute embolism and thrombosis of unspecified deep veins of right lower extremity: Secondary | ICD-10-CM | POA: Diagnosis not present

## 2023-07-17 DIAGNOSIS — I10 Essential (primary) hypertension: Secondary | ICD-10-CM | POA: Diagnosis not present

## 2023-07-17 DIAGNOSIS — M25571 Pain in right ankle and joints of right foot: Secondary | ICD-10-CM | POA: Diagnosis not present

## 2023-07-17 LAB — URIC ACID: Uric Acid, Serum: 5.9 mg/dL (ref 3.7–8.6)

## 2023-07-17 LAB — CBC WITH DIFFERENTIAL/PLATELET
Abs Immature Granulocytes: 0.03 10*3/uL (ref 0.00–0.07)
Basophils Absolute: 0 10*3/uL (ref 0.0–0.1)
Basophils Relative: 1 %
Eosinophils Absolute: 0.1 10*3/uL (ref 0.0–0.5)
Eosinophils Relative: 2 %
HCT: 41 % (ref 39.0–52.0)
Hemoglobin: 14.6 g/dL (ref 13.0–17.0)
Immature Granulocytes: 1 %
Lymphocytes Relative: 23 %
Lymphs Abs: 1.5 10*3/uL (ref 0.7–4.0)
MCH: 30.5 pg (ref 26.0–34.0)
MCHC: 35.6 g/dL (ref 30.0–36.0)
MCV: 85.6 fL (ref 80.0–100.0)
Monocytes Absolute: 0.7 10*3/uL (ref 0.1–1.0)
Monocytes Relative: 11 %
Neutro Abs: 4.2 10*3/uL (ref 1.7–7.7)
Neutrophils Relative %: 62 %
Platelets: 259 10*3/uL (ref 150–400)
RBC: 4.79 MIL/uL (ref 4.22–5.81)
RDW: 11.8 % (ref 11.5–15.5)
WBC: 6.6 10*3/uL (ref 4.0–10.5)
nRBC: 0 % (ref 0.0–0.2)

## 2023-07-17 LAB — BASIC METABOLIC PANEL WITH GFR
Anion gap: 9 (ref 5–15)
BUN: 12 mg/dL (ref 8–23)
CO2: 25 mmol/L (ref 22–32)
Calcium: 9.3 mg/dL (ref 8.9–10.3)
Chloride: 104 mmol/L (ref 98–111)
Creatinine, Ser: 0.74 mg/dL (ref 0.61–1.24)
GFR, Estimated: >60 mL/min (ref >60)
Glucose, Bld: 185 mg/dL — ABNORMAL HIGH (ref 70–99)
Potassium: 3.6 mmol/L (ref 3.5–5.1)
Sodium: 138 mmol/L (ref 135–145)

## 2023-07-17 MED ORDER — APIXABAN (ELIQUIS) VTE STARTER PACK (10MG AND 5MG): ORAL_TABLET | ORAL | 0 refills | Status: DC

## 2023-07-17 MED ORDER — ACETAMINOPHEN 325 MG PO TABS
650.0000 mg | ORAL_TABLET | Freq: Once | ORAL | Status: AC
  Administered 2023-07-17: 650 mg via ORAL
  Filled 2023-07-17: qty 2

## 2023-07-17 NOTE — ED Provider Notes (Signed)
 [facility] Provider Note    Event Date/Time   First MD Initiated Contact with Patient 07/17/23 1137     (approximate)   History   Leg Swelling (/)   HPI  Carl Crawford is a 63 y.o. male with history of diabetes, hypertension, GERD presents emergency department with redness and swelling of the right lower leg for 2 to 3 weeks.  Patient has not taken his diabetes medication since March.  States unsure if the redness and swelling could be infection because he has not been on his medication.  No known abrasions or scrapes.  No fever or chills.  No numbness or tingling.  No known injury.  States the right ankle is very tender and swollen      Physical Exam   Triage Vital Signs: ED Triage Vitals [07/17/23 1119]  Encounter Vitals Group     BP (!) 186/97     Girls Systolic BP Percentile      Girls Diastolic BP Percentile      Boys Systolic BP Percentile      Boys Diastolic BP Percentile      Pulse Rate 83     Resp 20     Temp 98.5 F (36.9 C)     Temp Source Oral     SpO2 100 %     Weight 212 lb (96.2 kg)     Height 6' 1 (1.854 m)     Head Circumference      Peak Flow      Pain Score 7     Pain Loc      Pain Education      Exclude from Growth Chart     Most recent vital signs: Vitals:   07/17/23 1119  BP: (!) 186/97  Pulse: 83  Resp: 20  Temp: 98.5 F (36.9 C)  SpO2: 100%     General: Awake, no distress.   CV:  Good peripheral perfusion.  Resp:  Normal effort.  Abd:  No distention.  Other:  Right lower leg tender at the calf, redness and swelling noted on foot and ankle with streak into the lower tib/fib, n/v intact    ED Results / Procedures / Treatments   Labs (all labs ordered are listed, but only abnormal results are displayed) Labs Reviewed  BASIC METABOLIC PANEL WITH GFR - Abnormal; Notable for the following components:      Result Value   Glucose, Bld 185 (*)    All other components within normal limits  CBC WITH  DIFFERENTIAL/PLATELET  URIC ACID     EKG     RADIOLOGY Ultrasound right lower extremity, x-ray right ankle    PROCEDURES:   Procedures  Critical Care:  no Chief Complaint  Patient presents with   Leg Swelling           MEDICATIONS ORDERED IN ED: Medications  acetaminophen  (TYLENOL ) tablet 650 mg (650 mg Oral Given 07/17/23 1320)     IMPRESSION / MDM / ASSESSMENT AND PLAN / ED COURSE  I reviewed the triage vital signs and the nursing notes.                              Differential diagnosis includes, but is not limited to, cellulitis, DVT, gout  Patient's presentation is most consistent with acute illness / injury with system symptoms.    Medications given: Tylenol  p.o.  Labs and imaging ordered  Glucose  185, WBC 6.60 these are reassuring especially considering patient has been off his medication for several months.  Uric acid reassuring  Ultrasound right lower extremity for DVT independently reviewed interpreted by me by looking at images and reading radiologist report.  Positive for DVT from great saphenous vein to the femoral vein.  Consult to vascular Spoke with Orvin Daring, NP from vascular, states start him on Eliquis .  Will have the office follow-up for an appointment.  I did explain these findings to patient.  He is in agreement with treatment plan.  He is to follow-up with vascular for the DVT.  He is to follow-up with his regular doctor concerning his diabetes and hypertension.  In his chart it looks like his doctor has already sent medications for this to the pharmacy so I will defer that at this time.  Patient was discharged stable condition.  Strict instructions to return if worsening      FINAL CLINICAL IMPRESSION(S) / ED DIAGNOSES   Final diagnoses:  DVT, lower extremity, distal, acute, right (HCC)     Rx / DC Orders   ED Discharge Orders          Ordered    APIXABAN  (ELIQUIS ) VTE STARTER PACK (10MG  AND 5MG )       Note to  Pharmacy: If starter pack unavailable, substitute with seventy-four 5 mg apixaban  tabs following the above SIG directions.   07/17/23 1508             Note:  This document was prepared using Dragon voice recognition software and may include unintentional dictation errors.    Gasper Devere ORN, PA-C 07/17/23 1522    Clarine Ozell LABOR, MD 07/19/23 [phone]

## 2023-07-17 NOTE — ED Triage Notes (Signed)
 Pt to ED via POV from home. Pt reports right lower leg swelling and pain to 2 different locations x2-3wks. Pt reports pain has gotten worse. Hx of DM. No injury or hx of DVT. No blood thinner. Denies SOB or CP.

## 2023-07-17 NOTE — Telephone Encounter (Signed)
 FYI Only or Action Required?: FYI only for provider.  Patient was last seen in primary care on 08/21/2022 by Carlean Charter, DO. Called Nurse Triage reporting Leg Swelling. Symptoms began today. Interventions attempted: Nothing. Symptoms are: unchanged.  Triage Disposition: Call EMS 911 Now  Patient/caregiver understands and will follow disposition?: Yes   Copied from CRM [id number]. Topic: Clinical - Red Word Triage >> Jul 17, 2023 10:10 AM Elle L wrote: Red Word that prompted transfer to Nurse Triage: The patient is diabetic and is right leg is causing him severe pain and it is swelling. Reason for Disposition  Sounds like a life-threatening emergency to the triager  Answer Assessment - Initial Assessment Questions 1. ONSET: When did the swelling start? (e.g., minutes, hours, days)     Awhile ago 2. LOCATION: What part of the leg is swollen?  Are both legs swollen or just one leg?     Right leg 3. SEVERITY: How bad is the swelling? (e.g., localized; mild, moderate, severe)   - Localized: Small area of swelling localized to one leg.   - MILD pedal edema: Swelling limited to foot and ankle, pitting edema < 1/4 inch (6 mm) deep, rest and elevation eliminate most or all swelling.   - MODERATE edema: Swelling of lower leg to knee, pitting edema > 1/4 inch (6 mm) deep, rest and elevation only partially reduce swelling.   - SEVERE edema: Swelling extends above knee, facial or hand swelling present.      mild 4. REDNESS: Does the swelling look red or infected?     yes 5. PAIN: Is the swelling painful to touch? If Yes, ask: How painful is it?   (Scale 1-10; mild, moderate or severe)     moderate 6. FEVER: Do you have a fever? If Yes, ask: What is it, how was it measured, and when did it start?      no 7. CAUSE: What do you think is causing the leg swelling?     dm 8. MEDICAL HISTORY: Do you have a history of blood clots (e.g., DVT), cancer, heart failure, kidney  disease, or liver failure?     no 9. RECURRENT SYMPTOM: Have you had leg swelling before? If Yes, ask: When was the last time? What happened that time?     no 10. OTHER SYMPTOMS: Do you have any other symptoms? (e.g., chest pain, difficulty breathing)       no 11. PREGNANCY: Is there any chance you are pregnant? When was your last menstrual period?       na  Protocols used: Leg Swelling and Edema-A-AH

## 2023-07-17 NOTE — Discharge Instructions (Signed)
 Follow-up with your for your diabetes and high blood pressure Follow-up with vascular for the blood clot in your leg.  I have sent a prescription for you to start taking today.  Vascular will call you for a follow-up appointment.  If you have not heard from them by the middle of next week please call them for an appointment  if you are worsening you should return the emergency department.

## 2023-07-17 NOTE — ED Notes (Signed)
 Patient requesting pain medication for leg pain rated 8/10. Fisher, PA notified.

## 2023-07-18 ENCOUNTER — Ambulatory Visit: Payer: Self-pay

## 2023-07-18 ENCOUNTER — Emergency Department
Admission: EM | Admit: 2023-07-18 | Discharge: 2023-07-18 | Disposition: A | Discharge status: Home / Self Care | Attending: Emergency Medicine | Admitting: Emergency Medicine

## 2023-07-18 ENCOUNTER — Other Ambulatory Visit: Payer: Self-pay

## 2023-07-18 DIAGNOSIS — I82491 Acute embolism and thrombosis of other specified deep vein of right lower extremity: Secondary | ICD-10-CM

## 2023-07-18 DIAGNOSIS — I10 Essential (primary) hypertension: Secondary | ICD-10-CM | POA: Insufficient documentation

## 2023-07-18 DIAGNOSIS — I82401 Acute embolism and thrombosis of unspecified deep veins of right lower extremity: Secondary | ICD-10-CM | POA: Diagnosis not present

## 2023-07-18 DIAGNOSIS — M7989 Other specified soft tissue disorders: Secondary | ICD-10-CM

## 2023-07-18 DIAGNOSIS — I82890 Acute embolism and thrombosis of other specified veins: Secondary | ICD-10-CM | POA: Diagnosis not present

## 2023-07-18 DIAGNOSIS — E119 Type 2 diabetes mellitus without complications: Secondary | ICD-10-CM | POA: Diagnosis not present

## 2023-07-18 DIAGNOSIS — Z7901 Long term (current) use of anticoagulants: Secondary | ICD-10-CM | POA: Diagnosis not present

## 2023-07-18 LAB — CBC
HCT: 44.4 % (ref 39.0–52.0)
Hemoglobin: 16.1 g/dL (ref 13.0–17.0)
MCH: 31.1 pg (ref 26.0–34.0)
MCHC: 36.3 g/dL — ABNORMAL HIGH (ref 30.0–36.0)
MCV: 85.7 fL (ref 80.0–100.0)
Platelets: 277 10*3/uL (ref 150–400)
RBC: 5.18 MIL/uL (ref 4.22–5.81)
RDW: 12 % (ref 11.5–15.5)
WBC: 6.3 10*3/uL (ref 4.0–10.5)
nRBC: 0 % (ref 0.0–0.2)

## 2023-07-18 LAB — COMPREHENSIVE METABOLIC PANEL WITH GFR
ALT: 83 U/L — ABNORMAL HIGH (ref 0–44)
AST: 50 U/L — ABNORMAL HIGH (ref 15–41)
Albumin: 4.3 g/dL (ref 3.5–5.0)
Alkaline Phosphatase: 55 U/L (ref 38–126)
Anion gap: 11 (ref 5–15)
BUN: 15 mg/dL (ref 8–23)
CO2: 22 mmol/L (ref 22–32)
Calcium: 9.4 mg/dL (ref 8.9–10.3)
Chloride: 103 mmol/L (ref 98–111)
Creatinine, Ser: 0.69 mg/dL (ref 0.61–1.24)
GFR, Estimated: >60 mL/min (ref >60)
Glucose, Bld: 120 mg/dL — ABNORMAL HIGH (ref 70–99)
Potassium: 3.8 mmol/L (ref 3.5–5.1)
Sodium: 136 mmol/L (ref 135–145)
Total Bilirubin: 0.7 mg/dL (ref 0.0–1.2)
Total Protein: 7.8 g/dL (ref 6.5–8.1)

## 2023-07-18 LAB — PROTIME-INR
INR: 1.1 (ref 0.8–1.2)
Prothrombin Time: 14.3 s (ref 11.4–15.2)

## 2023-07-18 NOTE — Telephone Encounter (Addendum)
 FYI Only or Action Required?: FYI only for provider.  Patient was last seen in primary care on 08/21/2022 by Carlean Charter, DO. Called Nurse Triage reporting Foot Swelling. Symptoms began today. Interventions attempted: Prescription medications: eliquis. Symptoms are: gradually worsening.  Triage Disposition: Go to ED or PCP/Alternative with Approval  Patient/caregiver understands and will follow disposition?: Yes   Copied from CRM [id number]. Topic: Clinical - Red Word Triage >> Jul 18, 2023  4:13 PM Carl Crawford wrote: Red Word that prompted transfer to Nurse Triage: The patient called stating he was seen yesterday for a blood clot in his right foot. He was prescribed APIXABAN (ELIQUIS) VTE STARTER PACK (10MG  AND 5MG ) but he says it does not seem like it is working because his foot has swelled even more. I will transfer him to [facility] Reason for Disposition  [1] Swelling is painful to touch AND [2] fever    Known blood clot and worsening swelling and discomfort to area.  Answer Assessment - Initial Assessment Questions 1. ONSET: When did the swelling start? (e.g., minutes, hours, days)     today 2. LOCATION: What part of the leg is swollen?  Are both legs swollen or just one leg?     Right foot 3. SEVERITY: How bad is the swelling? (e.g., localized; mild, moderate, severe)   - Localized: Small area of swelling localized to one leg.   - MILD pedal edema: Swelling limited to foot and ankle, pitting edema < 1/4 inch (6 mm) deep, rest and elevation eliminate most or all swelling.   - MODERATE edema: Swelling of lower leg to knee, pitting edema > 1/4 inch (6 mm) deep, rest and elevation only partially reduce swelling.   - SEVERE edema: Swelling extends above knee, facial or hand swelling present.      Moderate to severe 4. REDNESS: Does the swelling look red or infected?     some 5. PAIN: Is the swelling painful to touch? If Yes, ask: How painful is it?   (Scale 1-10; mild, moderate  or severe)     Mild to moderate 6. FEVER: Do you have a fever? If Yes, ask: What is it, how was it measured, and when did it start?      no 7. CAUSE: What do you think is causing the leg swelling?     DVT 8. MEDICAL HISTORY: Do you have a history of blood clots (e.g., DVT), cancer, heart failure, kidney disease, or liver failure?     Blood clot 9. RECURRENT SYMPTOM: Have you had leg swelling before? If Yes, ask: When was the last time? What happened that time?     yes 10. OTHER SYMPTOMS: Do you have any other symptoms? (e.g., chest pain, difficulty breathing)         N/a 11. PREGNANCY: Is there any chance you are pregnant? When was your last menstrual period?       N/a  Pt started on eliquis for clot: area is continuing to swell: pt states unable to get shoe on that foot: nurse referred pt to call 911 or have someone take him to the ED.  Protocols used: Leg Swelling and Edema-A-AH

## 2023-07-18 NOTE — ED Triage Notes (Signed)
 Pt sts that he was here yesterday for a blood clot in his right leg and today the swelling has gotten worse.

## 2023-07-18 NOTE — Discharge Instructions (Signed)
 Continue taking the Eliquis as prescribed twice a day, approximately 12 hours apart.  Follow-up with your primary care provider.  Return to the ER for new, worsening, or persistent swelling, swelling or redness that starts to go up to the calf or above the knee, chest pain, shortness of breath, or any other new or worsening symptoms that concern you.

## 2023-07-18 NOTE — ED Provider Notes (Signed)
 [facility] Provider Note    Event Date/Time   First MD Initiated Contact with Patient 07/18/23 1821     (approximate)   History   Leg Swelling   HPI  KAREY STUCKI is a 63 y.o. male with history of diabetes, hypertension, GERD, DVT who presents with right leg swelling.  The patient was diagnosed with a DVT yesterday and started on Eliquis.  He states that the swelling has gotten slightly worse especially over his foot and ankle.  He denies any swelling more proximal to this.  He denies any acute pain.  He states that the skin looks slightly red to him.  He denies any chest pain or difficulty breathing.  He feels like the medication is not working.  I reviewed the past medical records.  The patient was seen in the ED yesterday.  Ultrasound showed extensive thrombus throughout the right greater saphenous vein.  There was no extension into the common femoral vein.  Vascular surgery was consulted and the patient was recommended to start on Eliquis.   Physical Exam   Triage Vital Signs: ED Triage Vitals  Encounter Vitals Group     BP 07/18/23 1722 (!) 163/96     Girls Systolic BP Percentile --      Girls Diastolic BP Percentile --      Boys Systolic BP Percentile --      Boys Diastolic BP Percentile --      Pulse Rate 07/18/23 1722 76     Resp 07/18/23 1722 17     Temp 07/18/23 1722 97.8 F (36.6 C)     Temp Source 07/18/23 1722 Oral     SpO2 07/18/23 1722 97 %     Weight 07/18/23 1723 212 lb (96.2 kg)     Height 07/18/23 1723 6' 1 (1.854 m)     Head Circumference --      Peak Flow --      Pain Score 07/18/23 1723 7     Pain Loc --      Pain Education --      Exclude from Growth Chart --     Most recent vital signs: Vitals:   07/18/23 1722  BP: (!) 163/96  Pulse: 76  Resp: 17  Temp: 97.8 F (36.6 C)  SpO2: 97%     General: Awake, no distress.  CV:  Good peripheral perfusion.  Resp:  Normal effort.  Abd:  No distention.   Other:  Mild edema to the foot and ankle.  No erythema, induration, or abnormal warmth.  No tenderness.  No calf or popliteal swelling.  Full range of motion at the knee and ankle.  2+ DP pulse.  Normal cap refill.   ED Results / Procedures / Treatments   Labs (all labs ordered are listed, but only abnormal results are displayed) Labs Reviewed  CBC - Abnormal; Notable for the following components:      Result Value   MCHC 36.3 (*)    All other components within normal limits  COMPREHENSIVE METABOLIC PANEL WITH GFR - Abnormal; Notable for the following components:   Glucose, Bld 120 (*)    AST 50 (*)    ALT 83 (*)    All other components within normal limits  PROTIME-INR     EKG   RADIOLOGY   PROCEDURES:  Critical Care performed: No  Procedures   MEDICATIONS ORDERED IN ED: Medications - No data to display   IMPRESSION / MDM / ASSESSMENT  AND PLAN / ED COURSE  I reviewed the triage vital signs and the nursing notes.  63 year old male with PMH as noted above presents with slightly worsened swelling to the right foot and ankle after being diagnosed with a DVT yesterday.  The patient thought that the medication was supposed to resolve the swelling and so became concerned.  He states he has been compliant with the Eliquis.  Differential diagnosis includes, but is not limited to, acute DVT.  The ultrasound yesterday showed that it was throughout the saphenous vein but not the common femoral.  CMP, CBC and INR were obtained, all of which are within normal limits.  I discussed the case with Dr. Hampton Levins from vascular surgery.  She advises that there is no indication for additional or repeat imaging, and that the patient should continue the Eliquis.  I explained to the patient that the Eliquis does not dissolve the clot but is already there, but is mainly to help prevent the clot from extending or worsening.  I advised him on the expected time course of his symptoms.  I have  extended his work note given that his symptoms are persisting.  I gave strict return precautions, and he expressed understanding.  Patient's presentation is most consistent with acute, uncomplicated illness.     FINAL CLINICAL IMPRESSION(S) / ED DIAGNOSES   Final diagnoses:  Leg swelling  Acute deep vein thrombosis (DVT) of other specified vein of right lower extremity (HCC)     Rx / DC Orders   ED Discharge Orders     None        Note:  This document was prepared using Dragon voice recognition software and may include unintentional dictation errors.    Lind Repine, MD 07/18/23 1911

## 2023-07-18 NOTE — Telephone Encounter (Signed)
 Called pt back after triaging to ensure of his plan to go to ED: pt stated he is still planing on being seen at ED: stated probably have to drive because he does not want the cost of the rescue bill.

## 2023-07-18 NOTE — ED Notes (Addendum)
 Pt here to rule out dvt in right leg due to swelling in leg and ankle. Pt states he was seen here yesterday for same but it has now gotten worse. Pt states he was given medications for it. Pt has some edema noted to foot and ankle.     Chart reviewed and showed pt was positive for DVT yesterday.

## 2023-07-28 ENCOUNTER — Ambulatory Visit: Payer: Self-pay

## 2023-07-28 NOTE — Telephone Encounter (Signed)
 FYI Only or Action Required?: Action required by provider: request for appointment, clinical question for provider, and update on patient condition.  Patient was last seen in primary care on 08/21/2022 by Donzella Lauraine SAILOR, DO. Called Nurse Triage reporting Foot Swelling. Symptoms began several weeks ago. Interventions attempted: OTC medications: ibuprofen, Prescription medications: Eliquis , and Rest, hydration, or home remedies. Symptoms are: unchanged.  Triage Disposition: See PCP When Office is Open (Within 3 Days)  Patient/caregiver understands and will follow disposition?: Yes  Pt has been seen in the ED twice and has a clot in his foot. Has not had a f/u with pcp. Booked for first available on Thursday 07/31/23.  Pt states that pain, swelling and redness is unchanged from ED visit.      Copied from CRM [id number]. Topic: Clinical - Red Word Triage >> Jul 28, 2023  3:50 PM Montie POUR wrote: Red Word that prompted transfer to Nurse Triage:  His right foot has a blood clot per emergency room. It is still swollen, pain is at an 8 out of 10, very red. He wants to know if there is anything else he can do. He is taking blood thinners. Emergency room notes are in his chart. Reason for Disposition  [1] MODERATE pain (e.g., interferes with normal activities, limping) AND [2] present > 3 days  Answer Assessment - Initial Assessment Questions 1. ONSET: When did the pain start?      5-6 weeks 2. LOCATION: Where is the pain located?      Right foot 3. PAIN: How bad is the pain?    (Scale 1-10; or mild, moderate, severe)  - MILD (1-3): doesn't interfere with normal activities.   - MODERATE (4-7): interferes with normal activities (e.g., work or school) or awakens from sleep, limping.   - SEVERE (8-10): excruciating pain, unable to do any normal activities, unable to walk.      severe 4. WORK OR EXERCISE: Has there been any recent work or exercise that involved this part of the body?      no 5.  CAUSE: What do you think is causing the foot pain?     Blood clot in his foot 6. OTHER SYMPTOMS: Do you have any other symptoms? (e.g., leg pain, rash, fever, numbness)     swelling  Protocols used: Foot Pain-A-AH

## 2023-07-31 ENCOUNTER — Encounter: Payer: Self-pay | Admitting: Family Medicine

## 2023-07-31 ENCOUNTER — Ambulatory Visit: Admitting: Family Medicine

## 2023-07-31 VITALS — BP 168/80 | HR 72 | Resp 18 | Ht 73.0 in | Wt 218.7 lb

## 2023-07-31 DIAGNOSIS — I10 Essential (primary) hypertension: Secondary | ICD-10-CM | POA: Diagnosis not present

## 2023-07-31 DIAGNOSIS — E782 Mixed hyperlipidemia: Secondary | ICD-10-CM

## 2023-07-31 DIAGNOSIS — E1169 Type 2 diabetes mellitus with other specified complication: Secondary | ICD-10-CM | POA: Diagnosis not present

## 2023-07-31 DIAGNOSIS — I82491 Acute embolism and thrombosis of other specified deep vein of right lower extremity: Secondary | ICD-10-CM | POA: Diagnosis not present

## 2023-07-31 MED ORDER — LOSARTAN POTASSIUM 100 MG PO TABS
100.0000 mg | ORAL_TABLET | Freq: Every day | ORAL | 0 refills | Status: DC
Start: 2023-07-31 — End: 2023-08-27

## 2023-07-31 MED ORDER — EMPAGLIFLOZIN 10 MG PO TABS: 10.0000 mg | ORAL_TABLET | Freq: Every day | ORAL | 0 refills | Status: DC

## 2023-07-31 MED ORDER — APIXABAN 5 MG PO TABS: 5.0000 mg | ORAL_TABLET | Freq: Two times a day (BID) | ORAL | 1 refills | Status: DC

## 2023-07-31 NOTE — Progress Notes (Signed)
 Acute visit   Patient: Carl Crawford   DOB: 08-Jun-1960   63 y.o. Male  MRN: [id number] PCP: Donzella Lauraine SAILOR, DO   Chief Complaint  Patient presents with   Follow-up    Patient was seen at [facility] on 07/18/23 for leg swelling. Ultrasound was completed and showed extensive thrombus throughout the right greater saphenous vein.  There was no extension into the common femoral vein.  Vascular surgery was consulted and the patient was recommended to start on Eliquis . Patient was provided with 5 mg and 10 mg taper pack. He reports taking as prescribed and tolerating well. Reports still having some leg pain and will take aspirin or tylenol  for the pain.    Subjective    Discussed the use of AI scribe software for clinical note transcription with the patient, who gave verbal consent to proceed.   History of Present Illness   Carl Crawford is a 63 year old male who presents with persistent leg swelling and medication management.  He recently visited the emergency room and was diagnosed with a blood clot in his leg. He experiences persistent swelling and pain in the affected leg, especially when elevated. He is taking Eliquis , 5 mg twice daily, and has completed the initial starter pack. He is concerned about having two bottles of the same medication.  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 He aims to return to work by the 14th of the month.  He manages high blood pressure with one medication inconsistently and is not currently taking losartan . His blood pressure is elevated. He takes cholesterol medication regularly and medication for acid reflux, which does not interfere with his other medications.  He has an upcoming appointment with a vascular surgeon on the 8th of the month for an ultrasound and further evaluation.        Review of Systems  Objective    BP (!) 168/80 (BP Location: Left Arm, Patient Position:  Sitting, Cuff Size: Large)   Pulse 72   Resp 18   Ht 6' 1 (1.854 m)   Wt 218 lb 11.2 oz (99.2 kg)   BMI 28.85 kg/m   Physical Exam Vitals reviewed.  Constitutional:      General: He is not in acute distress.    Appearance: Normal appearance. He is not diaphoretic.  HENT:     Head: Normocephalic and atraumatic.  Eyes:     General: No scleral icterus.    Conjunctiva/sclera: Conjunctivae normal.  Cardiovascular:     Rate and Rhythm: Normal rate and regular rhythm.     Heart sounds: Normal heart sounds.  Pulmonary:     Effort: Pulmonary effort is normal. No respiratory distress.     Breath sounds: Normal breath sounds. No wheezing or rhonchi.  Musculoskeletal:     Cervical back: Neck supple.     Right lower leg: Edema present.     Left lower leg: No edema.  Lymphadenopathy:     Cervical: No cervical adenopathy.  Skin:    General: Skin is warm and dry.     Findings: No rash.  Neurological:     Mental Status: He is alert and oriented to person, place, and time. Mental status is at baseline.  Psychiatric:        Mood and Affect: Mood normal.        Behavior: Behavior normal.  No results found for any visits on 07/31/23.  Assessment & Plan     Problem List Items Addressed This Visit       Cardiovascular and Mediastinum   Primary hypertension   Relevant Medications   apixaban  (ELIQUIS ) 5 MG TABS tablet   losartan  (COZAAR ) 100 MG tablet     Endocrine   Mixed diabetic hyperlipidemia associated with type 2 diabetes mellitus (HCC)   Relevant Medications   apixaban  (ELIQUIS ) 5 MG TABS tablet   losartan  (COZAAR ) 100 MG tablet   empagliflozin  (JARDIANCE ) 10 MG TABS tablet     Other   Mixed hyperlipidemia   Relevant Medications   apixaban  (ELIQUIS ) 5 MG TABS tablet   losartan  (COZAAR ) 100 MG tablet   Other Visit Diagnoses       Acute deep vein thrombosis (DVT) of other specified vein of right lower extremity (HCC)    -  Primary   Relevant Medications    apixaban  (ELIQUIS ) 5 MG TABS tablet   losartan  (COZAAR ) 100 MG tablet           Deep vein thrombosis of lower extremity Deep vein thrombosis in the lower extremity with persistent swelling. He is on Eliquis  (apixaban ) to prevent clot progression and embolization. Swelling is expected to persist for up to three months as the body dissolves the clot. Reports pain upon elevation of the leg. Follow-up with vascular surgery is scheduled for August 05, 2023, where further evaluation and potential ultrasound will be conducted. Informed that if redness extends past the knee, he should return to the emergency room. - Continue Eliquis  (apixaban ) 5 mg twice daily for at least three months - Elevate the affected leg as much as possible - Follow up with vascular surgery on August 05, 2023  Hypertension Hypertension with elevated blood pressure noted during the visit. He is not consistently taking prescribed antihypertensive medications, including losartan . Blood pressure management is critical, especially in the context of concurrent anticoagulation therapy. - Refill losartan  prescription and ensure adherence to antihypertensive regimen - Monitor blood pressure regularly - Schedule follow-up with primary care provider to reassess blood pressure management  Type 2 diabetes mellitus Type 2 diabetes mellitus with a lapse in follow-up care. He has not seen the endocrinologist in over a year and is not currently taking prescribed diabetes medication, Jardiance . - Refill Jardiance  prescription - Schedule follow-up appointment with endocrinologist within the next month  Hyperlipidemia Hyperlipidemia with current medication adherence. He is taking prescribed cholesterol medication. - Continue current cholesterol medication regimen       Meds ordered this encounter  Medications   apixaban  (ELIQUIS ) 5 MG TABS tablet    Sig: Take 1 tablet (5 mg total) by mouth 2 (two) times daily.    Dispense:  60 tablet     Refill:  1   losartan  (COZAAR ) 100 MG tablet    Sig: Take 1 tablet (100 mg total) by mouth daily.    Dispense:  30 tablet    Refill:  0    Please tell patient appointment is needed for further refills   empagliflozin  (JARDIANCE ) 10 MG TABS tablet    Sig: Take 1 tablet (10 mg total) by mouth daily. Take one tablet by mouth daily    Dispense:  30 tablet    Refill:  0     Return in about 4 weeks (around 08/28/2023) for chronic disease f/u, With PCP.      Jon Eva, MD  [facility] [phone] (phone) [phone] (  fax)  [facility]

## 2023-08-04 ENCOUNTER — Other Ambulatory Visit (INDEPENDENT_AMBULATORY_CARE_PROVIDER_SITE_OTHER): Payer: Self-pay | Admitting: Nurse Practitioner

## 2023-08-04 DIAGNOSIS — I82811 Embolism and thrombosis of superficial veins of right lower extremities: Secondary | ICD-10-CM

## 2023-08-05 ENCOUNTER — Encounter (INDEPENDENT_AMBULATORY_CARE_PROVIDER_SITE_OTHER): Admitting: Nurse Practitioner

## 2023-08-05 ENCOUNTER — Encounter (INDEPENDENT_AMBULATORY_CARE_PROVIDER_SITE_OTHER)

## 2023-08-08 DIAGNOSIS — H2513 Age-related nuclear cataract, bilateral: Secondary | ICD-10-CM | POA: Diagnosis not present

## 2023-08-08 DIAGNOSIS — E119 Type 2 diabetes mellitus without complications: Secondary | ICD-10-CM | POA: Diagnosis not present

## 2023-08-08 LAB — HM DIABETES EYE EXAM

## 2023-08-12 ENCOUNTER — Other Ambulatory Visit: Payer: Self-pay | Admitting: Family Medicine

## 2023-08-12 DIAGNOSIS — E1169 Type 2 diabetes mellitus with other specified complication: Secondary | ICD-10-CM

## 2023-08-26 ENCOUNTER — Other Ambulatory Visit: Payer: Self-pay | Admitting: Family Medicine

## 2023-08-26 DIAGNOSIS — I10 Essential (primary) hypertension: Secondary | ICD-10-CM

## 2023-08-27 ENCOUNTER — Other Ambulatory Visit: Payer: Self-pay | Admitting: Family Medicine

## 2023-08-27 DIAGNOSIS — E1169 Type 2 diabetes mellitus with other specified complication: Secondary | ICD-10-CM

## 2023-09-03 ENCOUNTER — Ambulatory Visit: Admitting: Family Medicine

## 2023-09-03 ENCOUNTER — Encounter: Payer: Self-pay | Admitting: Family Medicine

## 2023-09-03 VITALS — BP 142/76 | HR 72 | Ht 73.0 in | Wt 212.3 lb

## 2023-09-03 DIAGNOSIS — K219 Gastro-esophageal reflux disease without esophagitis: Secondary | ICD-10-CM | POA: Diagnosis not present

## 2023-09-03 DIAGNOSIS — M79672 Pain in left foot: Secondary | ICD-10-CM | POA: Diagnosis not present

## 2023-09-03 DIAGNOSIS — E782 Mixed hyperlipidemia: Secondary | ICD-10-CM | POA: Diagnosis not present

## 2023-09-03 DIAGNOSIS — I10 Essential (primary) hypertension: Secondary | ICD-10-CM | POA: Diagnosis not present

## 2023-09-03 DIAGNOSIS — E1165 Type 2 diabetes mellitus with hyperglycemia: Secondary | ICD-10-CM | POA: Diagnosis not present

## 2023-09-03 DIAGNOSIS — Z79899 Other long term (current) drug therapy: Secondary | ICD-10-CM

## 2023-09-03 DIAGNOSIS — E1169 Type 2 diabetes mellitus with other specified complication: Secondary | ICD-10-CM

## 2023-09-03 MED ORDER — HYDROCHLOROTHIAZIDE 25 MG PO TABS: 25.0000 mg | ORAL_TABLET | Freq: Every day | ORAL | 1 refills | Status: DC

## 2023-09-03 MED ORDER — EMPAGLIFLOZIN 10 MG PO TABS: 10.0000 mg | ORAL_TABLET | Freq: Every day | ORAL | 0 refills | Status: DC

## 2023-09-03 MED ORDER — LOSARTAN POTASSIUM 100 MG PO TABS: 100.0000 mg | ORAL_TABLET | Freq: Every day | ORAL | 1 refills | Status: DC

## 2023-09-03 NOTE — Patient Instructions (Addendum)
 Recommend: Shingrix vaccine (shingles).    Do NOT take any NSAIDs (ibuprofen, aleve, etc) while taking Eliquis .

## 2023-09-03 NOTE — Progress Notes (Signed)
 Established patient visit   Patient: Carl Crawford   DOB: 10-29-1960   63 y.o. Male  MRN: [id number] Visit Date: 09/03/2023  Today's healthcare provider: LAURAINE LOISE BUOY, DO   Chief Complaint  Patient presents with   Medical Management of Chronic Issues   Hypertension   Hyperlipidemia   Subjective    Hypertension Pertinent negatives include no chest pain, headaches, palpitations or shortness of breath.  Hyperlipidemia Pertinent negatives include no chest pain or shortness of breath.   Carl Crawford is a 64 year old male with a history of a blood clot in the leg who presents with a sore on his left foot.  He has a sore on his left foot, described as a bump or disc-like formation, which is sore but not severely painful. He is able to walk on it, although it causes some discomfort. He is concerned about the possibility of another blood clot. Previously, his whole foot was swollen, and he has a picture of it on his cell phone.  He is currently taking a blood thinner. His medication regimen includes a sugar pill in the morning (Jardiance ), though he is unsure if he should take one or two. He is also on rosuvastatin , losartan  and hydrochlorothiazide . One of his medications causes frequent urination. He does not check his blood sugar regularly and is unsure if he has a glucose meter. He has been out of Jardiance  for a while but recently refilled it. He takes his blood pressure medication at night.  He uses an inhaler in the morning after work but is unsure of its necessity as he does not experience shortness of breath. He is left-handed and experiences occasional arm pain at night, which he attributes to possibly how he sleeps. No chest pain, shortness of breath, dizziness, or lightheadedness. He usually receives the flu vaccine through work.      Medications: Outpatient Medications Prior to Visit  Medication Sig   albuterol  (VENTOLIN  HFA) 108 (90 Base) MCG/ACT inhaler  Inhale 2 puffs into the lungs every 6 (six) hours as needed for wheezing or shortness of breath.   apixaban  (ELIQUIS ) 5 MG TABS tablet Take 1 tablet (5 mg total) by mouth 2 (two) times daily.   APIXABAN  (ELIQUIS ) VTE STARTER PACK (10MG  AND 5MG ) Take as directed on package: start with two-5mg  tablets twice daily for 7 days. On day 8, switch to one-5mg  tablet twice daily.   Blood Glucose Monitoring Suppl DEVI 1 each by Does not apply route daily before breakfast. May substitute to any manufacturer covered by patient's insurance.   esomeprazole (NEXIUM) 20 MG capsule Take 20 mg by mouth daily at 12 noon.   Glucose Blood (BLOOD GLUCOSE TEST STRIPS) STRP 1 each by In Vitro route daily before breakfast. May substitute to any manufacturer covered by patient's insurance.   ketoconazole  (NIZORAL ) 2 % cream Apply 1 Application topically daily.   Multiple Vitamins-Minerals (MENS MULTIVITAMIN PO) Take by mouth.   rosuvastatin  (CRESTOR ) 20 MG tablet TAKE 1 TABLET BY MOUTH EVERY DAY   Spacer/Aero-Holding Chambers (AEROCHAMBER MV) inhaler Use as instructed   triamcinolone  cream (KENALOG ) 0.1 % Apply 1 application. topically 2 (two) times daily. On areas that itch   [DISCONTINUED] Continuous Glucose Receiver (DEXCOM G7 RECEIVER) DEVI USE TO CHECK BLOOD SUGAR AS NEEDED FOR INSULIN DEPENDENT TYPE 2 DIABETES   [DISCONTINUED] Continuous Glucose Sensor (DEXCOM G7 SENSOR) MISC Use to check blood sugar as needed for insulin dependent type 2 diabetes   [  DISCONTINUED] hydrochlorothiazide  (HYDRODIURIL ) 25 MG tablet TAKE 1 TABLET (25 MG TOTAL) BY MOUTH DAILY.   [DISCONTINUED] JARDIANCE  10 MG TABS tablet TAKE 1 TABLET (10 MG TOTAL) BY MOUTH DAILY. TAKE ONE TABLET BY MOUTH DAILY   [DISCONTINUED] losartan  (COZAAR ) 100 MG tablet TAKE 1 TABLET BY MOUTH EVERY DAY   No facility-administered medications prior to visit.    Review of Systems  Respiratory: Negative.  Negative for cough, shortness of breath and wheezing.    Cardiovascular:  Negative for chest pain, palpitations and leg swelling (resolved).  Musculoskeletal:        + Endorses tenderness/pain over proximal fifth metatarsal on the left foot.  Neurological:  Negative for weakness and headaches.        Objective    BP (!) 142/76 (BP Location: Left Arm, Patient Position: Sitting, Cuff Size: Normal)   Pulse 72   Ht 6' 1 (1.854 m)   Wt 212 lb 4.8 oz (96.3 kg)   SpO2 98%   BMI 28.01 kg/m     Physical Exam Vitals and nursing note reviewed.  Constitutional:      General: He is not in acute distress.    Appearance: Normal appearance.  HENT:     Head: Normocephalic and atraumatic.  Eyes:     General: No scleral icterus.    Conjunctiva/sclera: Conjunctivae normal.  Cardiovascular:     Rate and Rhythm: Normal rate.  Pulmonary:     Effort: Pulmonary effort is normal.  Feet:     Comments: + tenderness to palpation over proximal fifth metatarsal on the left foot.  No evidence of swelling or redness. + Reddish/dusky discoloration to medial aspect of foot  Neurological:     Mental Status: He is alert and oriented to person, place, and time. Mental status is at baseline.  Psychiatric:        Mood and Affect: Mood normal.        Behavior: Behavior normal.      Results for orders placed or performed in visit on 09/03/23  Comprehensive metabolic panel with GFR  Result Value Ref Range   Glucose 154 (H) 70 - 99 mg/dL   BUN 11 8 - 27 mg/dL   Creatinine, Ser 9.05 0.76 - 1.27 mg/dL   eGFR 90 >40 fO/fpw/8.26   BUN/Creatinine Ratio 12 10 - 24   Sodium 136 134 - 144 mmol/L   Potassium 4.2 3.5 - 5.2 mmol/L   Chloride 100 96 - 106 mmol/L   CO2 19 (L) 20 - 29 mmol/L   Calcium  9.7 8.6 - 10.2 mg/dL   Total Protein 7.4 6.0 - 8.5 g/dL   Albumin 4.7 3.9 - 4.9 g/dL   Globulin, Total 2.7 1.5 - 4.5 g/dL   Bilirubin Total 0.5 0.0 - 1.2 mg/dL   Alkaline Phosphatase 72 44 - 121 IU/L   AST 49 (H) 0 - 40 IU/L   ALT 71 (H) 0 - 44 IU/L  Lipid Panel  With LDL/HDL Ratio  Result Value Ref Range   Cholesterol, Total 126 100 - 199 mg/dL   Triglycerides 94 0 - 149 mg/dL   HDL 39 (L) >60 mg/dL   VLDL Cholesterol Cal 18 5 - 40 mg/dL   LDL Chol Calc (NIH) 69 0 - 99 mg/dL   LDL/HDL Ratio 1.8 0.0 - 3.6 ratio  Hemoglobin A1c  Result Value Ref Range   Hgb A1c MFr Bld 6.6 (H) 4.8 - 5.6 %   Est. average glucose Bld gHb Est-mCnc 143 mg/dL  Microalbumin / creatinine urine ratio  Result Value Ref Range   Creatinine, Urine 84.7 Not Estab. mg/dL   Microalbumin, Urine 7.1 Not Estab. ug/mL   Microalb/Creat Ratio 8 0 - 29 mg/g creat  Vitamin B12  Result Value Ref Range   Vitamin B-12 259 232 - 1,245 pg/mL    Assessment & Plan    Left foot pain  Primary hypertension -     Comprehensive metabolic panel with GFR -     Losartan  Potassium; Take 1 tablet (100 mg total) by mouth daily.  Dispense: 90 tablet; Refill: 1 -     hydroCHLOROthiazide ; Take 1 tablet (25 mg total) by mouth daily.  Dispense: 90 tablet; Refill: 1  Type 2 diabetes mellitus with hyperglycemia, without long-term current use of insulin (HCC) -     Hemoglobin A1c -     Microalbumin / creatinine urine ratio  Mixed diabetic hyperlipidemia associated with type 2 diabetes mellitus (HCC) -     Lipid Panel With LDL/HDL Ratio -     Empagliflozin ; Take 1 tablet (10 mg total) by mouth daily.  Dispense: 90 tablet; Refill: 0  Gastroesophageal reflux disease, unspecified whether esophagitis present -     Vitamin B12  High risk medication use -     Vitamin B12      Left foot pain Pain and reported swelling likely bone-related, not recurrent DVT. Foot redness and swelling likely related to venous insufficiency. Differential for patient's pain favors bone issues over vascular. - Recommend new shoes for improved support. - Check feet daily for wounds or infections. - Follow up with vascular surgeon on August 14th at 9:30 AM for ultrasound and consultation.  Left lower extremity deep  vein thrombosis On Eliquis  for anticoagulation. No new clots suspected.  Follow-up with vascular surgery as scheduled  Type 2 diabetes mellitus Previously managed with Jardiance . Medication lapse not ideal for glycemic control. Current A1c unknown, previous A1c was 7.0% 06/2022. - Resend prescription for Jardiance . - Check A1c today. - Ensure consistent medication refills. - Consider increasing Jardiance  dose based on A1c result at next visit.  Hypertension Chronic, stable.  Mildly elevated today.  Managed with hydrochlorothiazide  and losartan . Suspect elevation due to running out of hydrochlorothiazide , based on dispense history. - Resend prescription for hydrochlorothiazide . - Ensure consistent medication refills.  Hyperlipidemia Managed with rosuvastatin  20 mg daily.  Left forearm pain (suspected musculoskeletal/overuse) Intermittent pain likely due to musculoskeletal overuse, primarily at night, possibly related to sleeping position. - Use Tylenol  for pain management. - Monitor symptoms to try to determine associated activities (if any) - Avoid ibuprofen or Aleve due to Eliquis  use. - Consider referral to physical therapy if not improving.    Return in about 3 months (around 12/04/2023) for CPE, and mAWV with AWV nurse.      I discussed the assessment and treatment plan with the patient  The patient was provided an opportunity to ask questions and all were answered. The patient agreed with the plan and demonstrated an understanding of the instructions.   The patient was advised to call back or seek an in-person evaluation if the symptoms worsen or if the condition fails to improve as anticipated.    LAURAINE LOISE BUOY, DO  [facility] [phone] (phone) [phone] (fax)  [facility]

## 2023-09-04 LAB — MICROALBUMIN / CREATININE URINE RATIO
Creatinine, Urine: 84.7 mg/dL
Microalb/Creat Ratio: 8 mg/g{creat} (ref 0–29)
Microalbumin, Urine: 7.1 ug/mL

## 2023-09-04 LAB — COMPREHENSIVE METABOLIC PANEL WITH GFR
ALT: 71 IU/L — ABNORMAL HIGH (ref 0–44)
AST: 49 IU/L — ABNORMAL HIGH (ref 0–40)
Albumin: 4.7 g/dL (ref 3.9–4.9)
Alkaline Phosphatase: 72 IU/L (ref 44–121)
BUN/Creatinine Ratio: 12 (ref 10–24)
BUN: 11 mg/dL (ref 8–27)
Bilirubin Total: 0.5 mg/dL (ref 0.0–1.2)
CO2: 19 mmol/L — ABNORMAL LOW (ref 20–29)
Calcium: 9.7 mg/dL (ref 8.6–10.2)
Chloride: 100 mmol/L (ref 96–106)
Creatinine, Ser: 0.94 mg/dL (ref 0.76–1.27)
Globulin, Total: 2.7 g/dL (ref 1.5–4.5)
Glucose: 154 mg/dL — ABNORMAL HIGH (ref 70–99)
Potassium: 4.2 mmol/L (ref 3.5–5.2)
Sodium: 136 mmol/L (ref 134–144)
Total Protein: 7.4 g/dL (ref 6.0–8.5)
eGFR: 90 mL/min/1.73 (ref >59)

## 2023-09-04 LAB — LIPID PANEL WITH LDL/HDL RATIO
Cholesterol, Total: 126 mg/dL (ref 100–199)
HDL: 39 mg/dL — ABNORMAL LOW (ref >39)
LDL Chol Calc (NIH): 69 mg/dL (ref 0–99)
LDL/HDL Ratio: 1.8 ratio (ref 0.0–3.6)
Triglycerides: 94 mg/dL (ref 0–149)
VLDL Cholesterol Cal: 18 mg/dL (ref 5–40)

## 2023-09-04 LAB — VITAMIN B12: Vitamin B-12: 259 pg/mL (ref 232–1245)

## 2023-09-04 LAB — HEMOGLOBIN A1C
Est. average glucose Bld gHb Est-mCnc: 143 mg/dL
Hgb A1c MFr Bld: 6.6 % — ABNORMAL HIGH (ref 4.8–5.6)

## 2023-09-10 ENCOUNTER — Ambulatory Visit: Payer: Self-pay | Admitting: Family Medicine

## 2023-09-10 DIAGNOSIS — E538 Deficiency of other specified B group vitamins: Secondary | ICD-10-CM

## 2023-09-10 MED ORDER — CYANOCOBALAMIN 500 MCG PO TABS
500.0000 ug | ORAL_TABLET | Freq: Every day | ORAL | 1 refills | Status: AC
Start: 2023-09-10 — End: ?

## 2023-09-11 ENCOUNTER — Encounter (INDEPENDENT_AMBULATORY_CARE_PROVIDER_SITE_OTHER): Payer: Self-pay | Admitting: Nurse Practitioner

## 2023-09-11 ENCOUNTER — Ambulatory Visit (INDEPENDENT_AMBULATORY_CARE_PROVIDER_SITE_OTHER)

## 2023-09-11 ENCOUNTER — Ambulatory Visit (INDEPENDENT_AMBULATORY_CARE_PROVIDER_SITE_OTHER): Admitting: Nurse Practitioner

## 2023-09-11 VITALS — BP 139/76 | HR 65 | Ht 73.0 in | Wt 213.6 lb

## 2023-09-11 DIAGNOSIS — I82811 Embolism and thrombosis of superficial veins of right lower extremities: Secondary | ICD-10-CM | POA: Diagnosis not present

## 2023-09-11 DIAGNOSIS — I10 Essential (primary) hypertension: Secondary | ICD-10-CM | POA: Diagnosis not present

## 2023-09-11 DIAGNOSIS — I7090 Unspecified atherosclerosis: Secondary | ICD-10-CM

## 2023-09-11 DIAGNOSIS — M722 Plantar fascial fibromatosis: Secondary | ICD-10-CM

## 2023-09-11 DIAGNOSIS — E1165 Type 2 diabetes mellitus with hyperglycemia: Secondary | ICD-10-CM | POA: Diagnosis not present

## 2023-09-15 ENCOUNTER — Encounter (INDEPENDENT_AMBULATORY_CARE_PROVIDER_SITE_OTHER): Payer: Self-pay | Admitting: Nurse Practitioner

## 2023-09-15 NOTE — Progress Notes (Signed)
 Subjective:    Patient ID: Carl Crawford, male    DOB: 06-28-1960, 63 y.o.   MRN: [id number] Chief Complaint  Patient presents with   New Patient (Initial Visit)    Follow up from ED , pt is having pain in his left foot , he concern that it my be DVT    The patient presents to the office for evaluation of DVT.  DVT was identified at [facility] by Duplex ultrasound dated 07/17/2023.    The initial symptoms were pain and swelling in the lower extremity.  The patient notes that the swelling and pain that he had previously have largely resolved.  His largest concern now is of pain in his left foot.  He notes that there is an area that is little bit more prominent.  It tends to hurt more so when he walks.  He has been on Eliquis  prescribed by the [place] without any significant issue.  Today noninvasive studies from a right SFA occlusion incidentally.  He has a partially recannulized great saphenous vein.    Review of Systems  Cardiovascular:  Negative for leg swelling.  Musculoskeletal:  Positive for arthralgias.  All other systems reviewed and are negative.      Objective:   Physical Exam Vitals reviewed.  HENT:     Head: Normocephalic.  Cardiovascular:     Rate and Rhythm: Normal rate.     Pulses: Normal pulses.  Pulmonary:     Effort: Pulmonary effort is normal.  Skin:    General: Skin is warm and dry.  Neurological:     Mental Status: He is alert and oriented to person, place, and time.  Psychiatric:        Mood and Affect: Mood normal.        Behavior: Behavior normal.        Thought Content: Thought content normal.        Judgment: Judgment normal.     BP 139/76   Pulse 65   Ht 6' 1 (1.854 m)   Wt 213 lb 9.6 oz (96.9 kg)   BMI 28.18 kg/m   Past Medical History:  Diagnosis Date   Acid reflux disease    Diabetes mellitus without complication (HCC)    Hypertension     Social History   Socioeconomic History   Marital status: Single    Spouse name: Not on file    Number of children: Not on file   Years of education: Not on file   Highest education level: Not on file  Occupational History   Not on file  Tobacco Use   Smoking status: Never   Smokeless tobacco: Never  Vaping Use   Vaping status: Never Used  Substance and Sexual Activity   Alcohol use: Never   Drug use: Not Currently   Sexual activity: Yes  Other Topics Concern   Not on file  Social History Narrative   Not on file   Social Drivers of Health   Financial Resource Strain: Not on file  Food Insecurity: Not on file  Transportation Needs: Not on file  Physical Activity: Not on file  Stress: Not on file  Social Connections: Not on file  Intimate Partner Violence: Not on file    Past Surgical History:  Procedure Laterality Date   COLONOSCOPY WITH PROPOFOL  N/A 05/23/2021   Procedure: COLONOSCOPY WITH PROPOFOL ;  Surgeon: Therisa Bi, MD;  Location: [facility] ENDOSCOPY;  Service: Gastroenterology;  Laterality: N/A;    Family History  Problem  Relation Age of Onset   Heart attack Father    Cancer Sister    Cirrhosis Sister    Heart attack Maternal Grandfather     No Known Allergies     Latest Ref Rng & Units 07/18/2023    5:24 PM 07/17/2023   11:55 AM 08/29/2022   12:43 PM  CBC  WBC 4.0 - 10.5 K/uL 6.3  6.6  5.6   Hemoglobin 13.0 - 17.0 g/dL 83.8  85.3  83.7   Hematocrit 39.0 - 52.0 % 44.4  41.0  44.6   Platelets 150 - 400 K/uL 277  259  262       CMP     Component Value Date/Time   NA 136 09/03/2023 1057   K 4.2 09/03/2023 1057   CL 100 09/03/2023 1057   CO2 19 (L) 09/03/2023 1057   GLUCOSE 154 (H) 09/03/2023 1057   GLUCOSE 120 (H) 07/18/2023 1724   BUN 11 09/03/2023 1057   CREATININE 0.94 09/03/2023 1057   CALCIUM  9.7 09/03/2023 1057   PROT 7.4 09/03/2023 1057   ALBUMIN 4.7 09/03/2023 1057   AST 49 (H) 09/03/2023 1057   ALT 71 (H) 09/03/2023 1057   ALKPHOS 72 09/03/2023 1057   BILITOT 0.5 09/03/2023 1057   EGFR 90 09/03/2023 1057   GFRNONAA >60 07/18/2023  1724     No results found.     Assessment & Plan:   1. Superficial thrombosis of right lower extremity (Primary) The patient had a very symptomatic superficial phlebitis of the right lower extremity.  He was placed on Eliquis  in the emergency room.  He will continue with this as there has not been any issues.  However we will plan on discontinuing this at his follow-up in 3 months.  Will also repeat DVT study at that time.  2. Plantar fasciitis The patient had a DVT to his right saphenous vein and so this would not account for the pain he is having his left.  Will refer to podiatry but based on his description of symptoms I suspect it is related to plantar fasciitis. - Ambulatory referral to Podiatry  3. Primary hypertension Continue antihypertensive medications as already ordered, these medications have been reviewed and there are no changes at this time.  4. Type 2 diabetes mellitus with hyperglycemia, without long-term current use of insulin (HCC) Continue hypoglycemic medications as already ordered, these medications have been reviewed and there are no changes at this time.  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  He denies any pain.  At his follow-up will also have ABIs done to evaluate  Current Outpatient Medications on File Prior to Visit  Medication Sig Dispense Refill   albuterol  (VENTOLIN  HFA) 108 (90 Base) MCG/ACT inhaler Inhale 2 puffs into the lungs every 6 (six) hours as needed for wheezing or shortness of breath. 8 g 2   apixaban  (ELIQUIS ) 5 MG TABS tablet Take 1 tablet (5 mg total) by mouth 2 (two) times daily. 60 tablet 1   Blood Glucose Monitoring Suppl DEVI 1 each by Does not apply route daily before breakfast. May substitute to any manufacturer covered by patient's insurance. 1 each 0   cyanocobalamin  (VITAMIN B12) 500 MCG tablet Take 1 tablet (500 mcg total) by mouth daily. 90  tablet 1   empagliflozin  (JARDIANCE ) 10 MG TABS tablet Take 1 tablet (10 mg total) by mouth daily. 90 tablet 0  esomeprazole (NEXIUM) 20 MG capsule Take 20 mg by mouth daily at 12 noon.     Glucose Blood (BLOOD GLUCOSE TEST STRIPS) STRP 1 each by In Vitro route daily before breakfast. May substitute to any manufacturer covered by patient's insurance. 100 strip 3   hydrochlorothiazide  (HYDRODIURIL ) 25 MG tablet Take 1 tablet (25 mg total) by mouth daily. 90 tablet 1   ketoconazole  (NIZORAL ) 2 % cream Apply 1 Application topically daily. 60 g 1   losartan  (COZAAR ) 100 MG tablet Take 1 tablet (100 mg total) by mouth daily. 90 tablet 1   Multiple Vitamins-Minerals (MENS MULTIVITAMIN PO) Take by mouth.     rosuvastatin  (CRESTOR ) 20 MG tablet TAKE 1 TABLET BY MOUTH EVERY DAY 90 tablet 3   Spacer/Aero-Holding Chambers (AEROCHAMBER MV) inhaler Use as instructed 1 each 0   triamcinolone  cream (KENALOG ) 0.1 % Apply 1 application. topically 2 (two) times daily. On areas that itch 45 g 2   APIXABAN  (ELIQUIS ) VTE STARTER PACK (10MG  AND 5MG ) Take as directed on package: start with two-5mg  tablets twice daily for 7 days. On day 8, switch to one-5mg  tablet twice daily. 74 each 0   No current facility-administered medications on file prior to visit.    There are no Patient Instructions on file for this visit. No follow-ups on file.   Alyza Artiaga E Mivaan Corbitt, NP

## 2023-10-08 ENCOUNTER — Other Ambulatory Visit (INDEPENDENT_AMBULATORY_CARE_PROVIDER_SITE_OTHER): Payer: Self-pay | Admitting: Nurse Practitioner

## 2023-10-08 DIAGNOSIS — I70201 Unspecified atherosclerosis of native arteries of extremities, right leg: Secondary | ICD-10-CM

## 2023-10-12 DIAGNOSIS — I872 Venous insufficiency (chronic) (peripheral): Secondary | ICD-10-CM | POA: Insufficient documentation

## 2023-10-12 NOTE — H&P (View-Only) (Signed)
 MRN : [id number]  Carl Crawford is a 63 y.o. (14-Apr-1960) male who presents with chief complaint of check circulation.  History of Present Illness:   The patient returns to the office for followup of atherosclerotic changes of the lower extremities and review of the noninvasive studies.   The patient notes that there has been a significant deterioration in the lower extremity symptoms.  The patient notes interval shortening of their claudication distance and development of mild rest pain symptoms. No new ulcers or wounds have occurred since the last visit.  There have been no significant changes to the patient's overall health care.  The patient denies amaurosis fugax or recent TIA symptoms. There are no recent neurological changes noted. There is no history of DVT, PE or superficial thrombophlebitis. The patient denies recent episodes of angina or shortness of breath.   ABI's Rt=0.69 and Lt=0.84 Right SFA occlusion was noted on a venous ultrasound study  No outpatient medications have been marked as taking for the 10/13/23 encounter (Appointment) with Jama, Cordella MATSU, MD.    Past Medical History:  Diagnosis Date   Acid reflux disease    Diabetes mellitus without complication (HCC)    Hypertension     Past Surgical History:  Procedure Laterality Date   COLONOSCOPY WITH PROPOFOL  N/A 05/23/2021   Procedure: COLONOSCOPY WITH PROPOFOL ;  Surgeon: Therisa Bi, MD;  Location: [facility] ENDOSCOPY;  Service: Gastroenterology;  Laterality: N/A;    Social History Social History   Tobacco Use   Smoking status: Never   Smokeless tobacco: Never  Vaping Use   Vaping status: Never Used  Substance Use Topics   Alcohol use: Never   Drug use: Not Currently    Family History Family History  Problem Relation Age of Onset   Heart attack Father    Cancer Sister    Cirrhosis Sister    Heart attack Maternal Grandfather      No Known Allergies   REVIEW OF SYSTEMS (Negative unless checked)  Constitutional: [] Weight loss  [] Fever  [] Chills Cardiac: [] Chest pain   [] Chest pressure   [] Palpitations   [] Shortness of breath when laying flat   [] Shortness of breath with exertion. Vascular:  [x] Pain in legs with walking   [] Pain in legs at rest  [] History of DVT   [] Phlebitis   [] Swelling in legs   [] Varicose veins   [] Non-healing ulcers Pulmonary:   [] Uses home oxygen   [] Productive cough   [] Hemoptysis   [] Wheeze  [] COPD   [] Asthma Neurologic:  [] Dizziness   [] Seizures   [] History of stroke   [] History of TIA  [] Aphasia   [] Vissual changes   [] Weakness or numbness in arm   [] Weakness or numbness in leg Musculoskeletal:   [] Joint swelling   [] Joint pain   [] Low back pain Hematologic:  [] Easy bruising  [] Easy bleeding   [] Hypercoagulable state   [] Anemic Gastrointestinal:  [] Diarrhea   [] Vomiting  [] Gastroesophageal reflux/heartburn   [] Difficulty swallowing. Genitourinary:  [] Chronic kidney disease   [] Difficult urination  [] Frequent urination   [] Blood in urine Skin:  [] Rashes   [] Ulcers  Psychological:  []   History of anxiety   []  History of major depression.  Physical Examination  There were no vitals filed for this visit. There is no height or weight on file to calculate BMI. Gen: WD/WN, NAD Head: [place]/AT, No temporalis wasting.  Ear/Nose/Throat: Hearing grossly intact, nares w/o erythema or drainage Eyes: PER, EOMI, sclera nonicteric.  Neck: Supple, no masses.  No bruit or JVD.  Pulmonary:  Good air movement, no audible wheezing, no use of accessory muscles.  Cardiac: RRR, normal S1, S2, no Murmurs. Vascular:  mild trophic changes, no open wounds Vessel Right Left  Radial Palpable Palpable  PT Not Palpable Not Palpable  DP Not Palpable Not Palpable  Gastrointestinal: soft, non-distended. No guarding/no peritoneal signs.  Musculoskeletal: M/S 5/5 throughout.  No visible deformity.  Neurologic: CN 2-12  intact. Pain and light touch intact in extremities.  Symmetrical.  Speech is fluent. Motor exam as listed above. Psychiatric: Judgment intact, Mood & affect appropriate for pt's clinical situation. Dermatologic: No rashes or ulcers noted.  No changes consistent with cellulitis.   CBC Lab Results  Component Value Date   WBC 6.3 07/18/2023   HGB 16.1 07/18/2023   HCT 44.4 07/18/2023   MCV 85.7 07/18/2023   PLT 277 07/18/2023    BMET    Component Value Date/Time   NA 136 09/03/2023 1057   K 4.2 09/03/2023 1057   CL 100 09/03/2023 1057   CO2 19 (L) 09/03/2023 1057   GLUCOSE 154 (H) 09/03/2023 1057   GLUCOSE 120 (H) 07/18/2023 1724   BUN 11 09/03/2023 1057   CREATININE 0.94 09/03/2023 1057   CALCIUM  9.7 09/03/2023 1057   GFRNONAA >60 07/18/2023 1724   CrCl cannot be calculated (Patient's most recent lab result is older than the maximum 21 days allowed.).  COAG Lab Results  Component Value Date   INR 1.1 07/18/2023    Radiology No results found.   Assessment/Plan 1. 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  The patient has evidence of severe atherosclerotic changes of both lower extremities with rest pain that is associated with preulcerative changes and impending tissue loss of the left foot.  This represents a limb threatening ischemia and places the patient at the risk for left limb loss.  Patient should undergo angiography of the left lower extremity with the hope for intervention for limb salvage.  The risks and benefits as well as the alternative therapies was discussed in detail with the patient.  All questions were answered.  Patient agrees to proceed with left lower extremity angiography.  The patient will follow up with me in the office after the procedure.      2. Chronic venous insufficiency  (Primary) No surgery or intervention at this point in time.   The patient is CEAP [id number]   I have discussed with the patient venous insufficiency and why it  causes symptoms. I have discussed with the patient the chronic skin changes that accompany venous insufficiency and the long term sequela such as infection and ulceration.  Patient will begin wearing graduated compression stockings or compression wraps on a daily basis.  The patient will put the compression on first thing in the morning and removing them in the evening. The patient is instructed specifically not to sleep in the compression.    In addition, behavioral modification including several periods of elevation  of the lower extremities during the day will be continued. I have demonstrated that proper elevation is a position with the ankles at heart level.  The patient is instructed to begin routine exercise, especially walking on a daily basis  The patient will be assessed for a Lymph Pump depending on the effectiveness of conservative therapy and the control of the associated lymphedema.  3. Primary hypertension Continue antihypertensive medications as already ordered, these medications have been reviewed and there are no changes at this time.  4. Type 2 diabetes mellitus with hyperglycemia, without long-term current use of insulin (HCC) Continue hypoglycemic medications as already ordered, these medications have been reviewed and there are no changes at this time.  Hgb A1C to be monitored as already arranged by primary service  5. Mixed hyperlipidemia Continue statin as ordered and reviewed, no changes at this time   Cordella Shawl, MD  10/12/2023 4:48 PM

## 2023-10-12 NOTE — Progress Notes (Signed)
 MRN : [id number]  Carl Crawford is a 63 y.o. (14-Apr-1960) male who presents with chief complaint of check circulation.  History of Present Illness:   The patient returns to the office for followup of atherosclerotic changes of the lower extremities and review of the noninvasive studies.   The patient notes that there has been a significant deterioration in the lower extremity symptoms.  The patient notes interval shortening of their claudication distance and development of mild rest pain symptoms. No new ulcers or wounds have occurred since the last visit.  There have been no significant changes to the patient's overall health care.  The patient denies amaurosis fugax or recent TIA symptoms. There are no recent neurological changes noted. There is no history of DVT, PE or superficial thrombophlebitis. The patient denies recent episodes of angina or shortness of breath.   ABI's Rt=0.69 and Lt=0.84 Right SFA occlusion was noted on a venous ultrasound study  No outpatient medications have been marked as taking for the 10/13/23 encounter (Appointment) with Jama, Cordella MATSU, MD.    Past Medical History:  Diagnosis Date   Acid reflux disease    Diabetes mellitus without complication (HCC)    Hypertension     Past Surgical History:  Procedure Laterality Date   COLONOSCOPY WITH PROPOFOL  N/A 05/23/2021   Procedure: COLONOSCOPY WITH PROPOFOL ;  Surgeon: Therisa Bi, MD;  Location: [facility] ENDOSCOPY;  Service: Gastroenterology;  Laterality: N/A;    Social History Social History   Tobacco Use   Smoking status: Never   Smokeless tobacco: Never  Vaping Use   Vaping status: Never Used  Substance Use Topics   Alcohol use: Never   Drug use: Not Currently    Family History Family History  Problem Relation Age of Onset   Heart attack Father    Cancer Sister    Cirrhosis Sister    Heart attack Maternal Grandfather      No Known Allergies   REVIEW OF SYSTEMS (Negative unless checked)  Constitutional: [] Weight loss  [] Fever  [] Chills Cardiac: [] Chest pain   [] Chest pressure   [] Palpitations   [] Shortness of breath when laying flat   [] Shortness of breath with exertion. Vascular:  [x] Pain in legs with walking   [] Pain in legs at rest  [] History of DVT   [] Phlebitis   [] Swelling in legs   [] Varicose veins   [] Non-healing ulcers Pulmonary:   [] Uses home oxygen   [] Productive cough   [] Hemoptysis   [] Wheeze  [] COPD   [] Asthma Neurologic:  [] Dizziness   [] Seizures   [] History of stroke   [] History of TIA  [] Aphasia   [] Vissual changes   [] Weakness or numbness in arm   [] Weakness or numbness in leg Musculoskeletal:   [] Joint swelling   [] Joint pain   [] Low back pain Hematologic:  [] Easy bruising  [] Easy bleeding   [] Hypercoagulable state   [] Anemic Gastrointestinal:  [] Diarrhea   [] Vomiting  [] Gastroesophageal reflux/heartburn   [] Difficulty swallowing. Genitourinary:  [] Chronic kidney disease   [] Difficult urination  [] Frequent urination   [] Blood in urine Skin:  [] Rashes   [] Ulcers  Psychological:  []   History of anxiety   []  History of major depression.  Physical Examination  There were no vitals filed for this visit. There is no height or weight on file to calculate BMI. Gen: WD/WN, NAD Head: [place]/AT, No temporalis wasting.  Ear/Nose/Throat: Hearing grossly intact, nares w/o erythema or drainage Eyes: PER, EOMI, sclera nonicteric.  Neck: Supple, no masses.  No bruit or JVD.  Pulmonary:  Good air movement, no audible wheezing, no use of accessory muscles.  Cardiac: RRR, normal S1, S2, no Murmurs. Vascular:  mild trophic changes, no open wounds Vessel Right Left  Radial Palpable Palpable  PT Not Palpable Not Palpable  DP Not Palpable Not Palpable  Gastrointestinal: soft, non-distended. No guarding/no peritoneal signs.  Musculoskeletal: M/S 5/5 throughout.  No visible deformity.  Neurologic: CN 2-12  intact. Pain and light touch intact in extremities.  Symmetrical.  Speech is fluent. Motor exam as listed above. Psychiatric: Judgment intact, Mood & affect appropriate for pt's clinical situation. Dermatologic: No rashes or ulcers noted.  No changes consistent with cellulitis.   CBC Lab Results  Component Value Date   WBC 6.3 07/18/2023   HGB 16.1 07/18/2023   HCT 44.4 07/18/2023   MCV 85.7 07/18/2023   PLT 277 07/18/2023    BMET    Component Value Date/Time   NA 136 09/03/2023 1057   K 4.2 09/03/2023 1057   CL 100 09/03/2023 1057   CO2 19 (L) 09/03/2023 1057   GLUCOSE 154 (H) 09/03/2023 1057   GLUCOSE 120 (H) 07/18/2023 1724   BUN 11 09/03/2023 1057   CREATININE 0.94 09/03/2023 1057   CALCIUM  9.7 09/03/2023 1057   GFRNONAA >60 07/18/2023 1724   CrCl cannot be calculated (Patient's most recent lab result is older than the maximum 21 days allowed.).  COAG Lab Results  Component Value Date   INR 1.1 07/18/2023    Radiology No results found.   Assessment/Plan 1. 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  The patient has evidence of severe atherosclerotic changes of both lower extremities with rest pain that is associated with preulcerative changes and impending tissue loss of the left foot.  This represents a limb threatening ischemia and places the patient at the risk for left limb loss.  Patient should undergo angiography of the left lower extremity with the hope for intervention for limb salvage.  The risks and benefits as well as the alternative therapies was discussed in detail with the patient.  All questions were answered.  Patient agrees to proceed with left lower extremity angiography.  The patient will follow up with me in the office after the procedure.      2. Chronic venous insufficiency  (Primary) No surgery or intervention at this point in time.   The patient is CEAP [id number]   I have discussed with the patient venous insufficiency and why it  causes symptoms. I have discussed with the patient the chronic skin changes that accompany venous insufficiency and the long term sequela such as infection and ulceration.  Patient will begin wearing graduated compression stockings or compression wraps on a daily basis.  The patient will put the compression on first thing in the morning and removing them in the evening. The patient is instructed specifically not to sleep in the compression.    In addition, behavioral modification including several periods of elevation  of the lower extremities during the day will be continued. I have demonstrated that proper elevation is a position with the ankles at heart level.  The patient is instructed to begin routine exercise, especially walking on a daily basis  The patient will be assessed for a Lymph Pump depending on the effectiveness of conservative therapy and the control of the associated lymphedema.  3. Primary hypertension Continue antihypertensive medications as already ordered, these medications have been reviewed and there are no changes at this time.  4. Type 2 diabetes mellitus with hyperglycemia, without long-term current use of insulin (HCC) Continue hypoglycemic medications as already ordered, these medications have been reviewed and there are no changes at this time.  Hgb A1C to be monitored as already arranged by primary service  5. Mixed hyperlipidemia Continue statin as ordered and reviewed, no changes at this time   Cordella Shawl, MD  10/12/2023 4:48 PM

## 2023-10-13 ENCOUNTER — Ambulatory Visit (INDEPENDENT_AMBULATORY_CARE_PROVIDER_SITE_OTHER)

## 2023-10-13 ENCOUNTER — Ambulatory Visit (INDEPENDENT_AMBULATORY_CARE_PROVIDER_SITE_OTHER): Admitting: Vascular Surgery

## 2023-10-13 ENCOUNTER — Encounter (INDEPENDENT_AMBULATORY_CARE_PROVIDER_SITE_OTHER): Payer: Self-pay | Admitting: Vascular Surgery

## 2023-10-13 VITALS — BP 133/78 | HR 73 | Resp 16 | Ht 73.0 in | Wt 211.2 lb

## 2023-10-13 DIAGNOSIS — I872 Venous insufficiency (chronic) (peripheral): Secondary | ICD-10-CM | POA: Diagnosis not present

## 2023-10-13 DIAGNOSIS — I70201 Unspecified atherosclerosis of native arteries of extremities, right leg: Secondary | ICD-10-CM | POA: Diagnosis not present

## 2023-10-13 DIAGNOSIS — I10 Essential (primary) hypertension: Secondary | ICD-10-CM

## 2023-10-13 DIAGNOSIS — E1165 Type 2 diabetes mellitus with hyperglycemia: Secondary | ICD-10-CM

## 2023-10-13 DIAGNOSIS — I70223 Atherosclerosis of native arteries of extremities with rest pain, bilateral legs: Secondary | ICD-10-CM | POA: Diagnosis not present

## 2023-10-13 DIAGNOSIS — E782 Mixed hyperlipidemia: Secondary | ICD-10-CM

## 2023-10-17 ENCOUNTER — Telehealth (INDEPENDENT_AMBULATORY_CARE_PROVIDER_SITE_OTHER): Payer: Self-pay

## 2023-10-17 NOTE — Telephone Encounter (Signed)
 Spoke with the patient and he is scheduled with Dr. Jama on 10/21/23 with a 9:00 am arrival time to the [facility] for a left leg angio. Pre-procedure instructions were discussed and will be sent to Mychart.

## 2023-10-21 ENCOUNTER — Ambulatory Visit
Admission: RE | Admit: 2023-10-21 | Discharge: 2023-10-21 | Disposition: A | Discharge status: Home / Self Care | Attending: Vascular Surgery | Admitting: Vascular Surgery

## 2023-10-21 ENCOUNTER — Other Ambulatory Visit: Payer: Self-pay

## 2023-10-21 ENCOUNTER — Encounter: Payer: Self-pay | Admitting: Vascular Surgery

## 2023-10-21 ENCOUNTER — Other Ambulatory Visit: Payer: Self-pay | Admitting: Family Medicine

## 2023-10-21 ENCOUNTER — Encounter
Admission: RE | Disposition: A | Discharge status: Home / Self Care | Payer: Self-pay | Source: Home / Self Care | Attending: Vascular Surgery

## 2023-10-21 ENCOUNTER — Encounter (INDEPENDENT_AMBULATORY_CARE_PROVIDER_SITE_OTHER): Payer: Self-pay | Admitting: Vascular Surgery

## 2023-10-21 DIAGNOSIS — I70229 Atherosclerosis of native arteries of extremities with rest pain, unspecified extremity: Secondary | ICD-10-CM | POA: Insufficient documentation

## 2023-10-21 DIAGNOSIS — I70222 Atherosclerosis of native arteries of extremities with rest pain, left leg: Secondary | ICD-10-CM | POA: Diagnosis not present

## 2023-10-21 DIAGNOSIS — E1165 Type 2 diabetes mellitus with hyperglycemia: Secondary | ICD-10-CM | POA: Insufficient documentation

## 2023-10-21 DIAGNOSIS — Z7984 Long term (current) use of oral hypoglycemic drugs: Secondary | ICD-10-CM | POA: Diagnosis not present

## 2023-10-21 DIAGNOSIS — E782 Mixed hyperlipidemia: Secondary | ICD-10-CM | POA: Insufficient documentation

## 2023-10-21 DIAGNOSIS — I10 Essential (primary) hypertension: Secondary | ICD-10-CM | POA: Insufficient documentation

## 2023-10-21 DIAGNOSIS — I700 Atherosclerosis of aorta: Secondary | ICD-10-CM

## 2023-10-21 DIAGNOSIS — I70223 Atherosclerosis of native arteries of extremities with rest pain, bilateral legs: Secondary | ICD-10-CM | POA: Insufficient documentation

## 2023-10-21 DIAGNOSIS — E1151 Type 2 diabetes mellitus with diabetic peripheral angiopathy without gangrene: Secondary | ICD-10-CM | POA: Insufficient documentation

## 2023-10-21 DIAGNOSIS — Z79899 Other long term (current) drug therapy: Secondary | ICD-10-CM | POA: Diagnosis not present

## 2023-10-21 DIAGNOSIS — I743 Embolism and thrombosis of arteries of the lower extremities: Secondary | ICD-10-CM

## 2023-10-21 DIAGNOSIS — I872 Venous insufficiency (chronic) (peripheral): Secondary | ICD-10-CM | POA: Insufficient documentation

## 2023-10-21 HISTORY — PX: LOWER EXTREMITY INTERVENTION: CATH118252

## 2023-10-21 HISTORY — PX: LOWER EXTREMITY ANGIOGRAPHY: CATH118251

## 2023-10-21 LAB — GLUCOSE, CAPILLARY
Glucose-Capillary: 114 mg/dL — ABNORMAL HIGH (ref 70–99)
Glucose-Capillary: 118 mg/dL — ABNORMAL HIGH (ref 70–99)

## 2023-10-21 LAB — CREATININE, SERUM
Creatinine, Ser: 0.88 mg/dL (ref 0.61–1.24)
GFR, Estimated: >60 mL/min (ref >60)

## 2023-10-21 LAB — BUN: BUN: 13 mg/dL (ref 8–23)

## 2023-10-21 SURGERY — LOWER EXTREMITY INTERVENTION
Anesthesia: Moderate Sedation | Site: Leg Lower | Laterality: Left

## 2023-10-21 MED ORDER — HEPARIN SODIUM (PORCINE) 1000 UNIT/ML IJ SOLN
INTRAMUSCULAR | Status: DC | PRN
  Administered 2023-10-21: 6000 [IU] via INTRAVENOUS

## 2023-10-21 MED ORDER — DIPHENHYDRAMINE HCL 50 MG/ML IJ SOLN: 50.0000 mg | Freq: Once | INTRAMUSCULAR | Status: DC | PRN

## 2023-10-21 MED ORDER — MORPHINE SULFATE (PF) 2 MG/ML IV SOLN: 2.0000 mg | INTRAVENOUS | Status: DC | PRN

## 2023-10-21 MED ORDER — MIDAZOLAM HCL 5 MG/5ML IJ SOLN
INTRAMUSCULAR | Status: AC
  Filled 2023-10-21: qty 5

## 2023-10-21 MED ORDER — SODIUM CHLORIDE 0.9 % IV SOLN: INTRAVENOUS | Status: DC

## 2023-10-21 MED ORDER — CEFAZOLIN SODIUM-DEXTROSE 2-4 GM/100ML-% IV SOLN
INTRAVENOUS | Status: AC
  Filled 2023-10-21: qty 100

## 2023-10-21 MED ORDER — ATORVASTATIN CALCIUM 10 MG PO TABS
10.0000 mg | ORAL_TABLET | Freq: Every day | ORAL | Status: DC
  Filled 2023-10-21: qty 1

## 2023-10-21 MED ORDER — IODIXANOL 320 MG/ML IV SOLN
INTRAVENOUS | Status: DC | PRN
  Administered 2023-10-21: 65 mL

## 2023-10-21 MED ORDER — ASPIRIN 325 MG PO TBEC
DELAYED_RELEASE_TABLET | ORAL | Status: AC
  Filled 2023-10-21: qty 1

## 2023-10-21 MED ORDER — FENTANYL CITRATE (PF) 100 MCG/2ML IJ SOLN
INTRAMUSCULAR | Status: DC | PRN
  Administered 2023-10-21 (×2): 50 ug via INTRAVENOUS

## 2023-10-21 MED ORDER — FENTANYL CITRATE (PF) 100 MCG/2ML IJ SOLN
INTRAMUSCULAR | Status: AC
  Filled 2023-10-21: qty 2

## 2023-10-21 MED ORDER — ONDANSETRON HCL 4 MG/2ML IJ SOLN: 4.0000 mg | Freq: Four times a day (QID) | INTRAMUSCULAR | Status: DC | PRN

## 2023-10-21 MED ORDER — OXYCODONE HCL 5 MG PO TABS: 5.0000 mg | ORAL_TABLET | ORAL | Status: DC | PRN

## 2023-10-21 MED ORDER — ACETAMINOPHEN 325 MG PO TABS: 650.0000 mg | ORAL_TABLET | ORAL | Status: DC | PRN

## 2023-10-21 MED ORDER — HEPARIN (PORCINE) IN NACL 1000-0.9 UT/500ML-% IV SOLN
INTRAVENOUS | Status: DC | PRN
  Administered 2023-10-21: 1000 mL

## 2023-10-21 MED ORDER — MIDAZOLAM HCL 2 MG/ML PO SYRP: 8.0000 mg | ORAL_SOLUTION | Freq: Once | ORAL | Status: DC | PRN

## 2023-10-21 MED ORDER — SODIUM CHLORIDE 0.9% FLUSH: 3.0000 mL | INTRAVENOUS | Status: DC | PRN

## 2023-10-21 MED ORDER — HYDROMORPHONE HCL 1 MG/ML IJ SOLN
1.0000 mg | Freq: Once | INTRAMUSCULAR | Status: AC | PRN
  Administered 2023-10-21: 1 mg via INTRAVENOUS

## 2023-10-21 MED ORDER — LABETALOL HCL 5 MG/ML IV SOLN: 10.0000 mg | INTRAVENOUS | Status: DC | PRN

## 2023-10-21 MED ORDER — HYDRALAZINE HCL 20 MG/ML IJ SOLN: 5.0000 mg | INTRAMUSCULAR | Status: DC | PRN

## 2023-10-21 MED ORDER — ASPIRIN 81 MG PO TBEC: 81.0000 mg | DELAYED_RELEASE_TABLET | Freq: Every day | ORAL | 1 refills | Status: AC

## 2023-10-21 MED ORDER — CEFAZOLIN SODIUM-DEXTROSE 2-4 GM/100ML-% IV SOLN
2.0000 g | INTRAVENOUS | Status: DC
Start: 2023-10-21 — End: 2023-10-21

## 2023-10-21 MED ORDER — ASPIRIN 325 MG PO TBEC
325.0000 mg | DELAYED_RELEASE_TABLET | ORAL | Status: AC
  Administered 2023-10-21: 325 mg via ORAL

## 2023-10-21 MED ORDER — SODIUM CHLORIDE 0.9% FLUSH: 3.0000 mL | Freq: Two times a day (BID) | INTRAVENOUS | Status: DC

## 2023-10-21 MED ORDER — FAMOTIDINE 20 MG PO TABS: 40.0000 mg | ORAL_TABLET | Freq: Once | ORAL | Status: DC | PRN

## 2023-10-21 MED ORDER — SODIUM CHLORIDE 0.9 % IV SOLN: 250.0000 mL | INTRAVENOUS | Status: DC | PRN

## 2023-10-21 MED ORDER — HEPARIN SODIUM (PORCINE) 1000 UNIT/ML IJ SOLN
INTRAMUSCULAR | Status: AC
  Filled 2023-10-21: qty 10

## 2023-10-21 MED ORDER — METHYLPREDNISOLONE SODIUM SUCC 125 MG IJ SOLR: 125.0000 mg | Freq: Once | INTRAMUSCULAR | Status: DC | PRN

## 2023-10-21 MED ORDER — MIDAZOLAM HCL 2 MG/2ML IJ SOLN
INTRAMUSCULAR | Status: DC | PRN
  Administered 2023-10-21: 2 mg via INTRAVENOUS
  Administered 2023-10-21: 1 mg via INTRAVENOUS

## 2023-10-21 MED ORDER — HYDROMORPHONE HCL 1 MG/ML IJ SOLN
INTRAMUSCULAR | Status: AC
  Filled 2023-10-21: qty 1

## 2023-10-21 MED ORDER — LIDOCAINE HCL (PF) 1 % IJ SOLN
INTRAMUSCULAR | Status: DC | PRN
  Administered 2023-10-21: 10 mL

## 2023-10-21 SURGICAL SUPPLY — 21 items
BALLOON LUTONIX 6X220X130 (BALLOONS) IMPLANT
CATH ANGIO 5F PIGTAIL 65CM (CATHETERS) IMPLANT
CATH BEACON 5 .035 65 RIM TIP (CATHETERS) IMPLANT
CATH BEACON 5 .038 100 VERT TP (CATHETERS) IMPLANT
CATH ROTAREX 135 6FR (CATHETERS) IMPLANT
COVER PROBE ULTRASOUND 5X96 (MISCELLANEOUS) IMPLANT
DEVICE PRESTO INFLATION (MISCELLANEOUS) IMPLANT
DEVICE STARCLOSE SE CLOSURE (Vascular Products) IMPLANT
GLIDEWIRE ADV .035X260CM (WIRE) IMPLANT
GOWN STRL REUS W/ TWL LRG LVL3 (GOWN DISPOSABLE) ×1 IMPLANT
LIFESTENT SOLO 7X200X135 (Permanent Stent) IMPLANT
NDL ENTRY 21GA 7CM ECHOTIP (NEEDLE) IMPLANT
NEEDLE ENTRY 21GA 7CM ECHOTIP (NEEDLE) ×1 IMPLANT
PACK ANGIOGRAPHY (CUSTOM PROCEDURE TRAY) ×1 IMPLANT
SET INTRO CAPELLA COAXIAL (SET/KITS/TRAYS/PACK) IMPLANT
SHEATH BRITE TIP 5FRX11 (SHEATH) IMPLANT
SHEATH RAABE 6FRX70 (SHEATH) IMPLANT
TUBING CONTRAST HIGH PRESS 72 (TUBING) IMPLANT
WIRE G V18X300CM (WIRE) IMPLANT
WIRE J 3MM .035X145CM (WIRE) IMPLANT
WIRE SUPRACORE 300CM (WIRE) IMPLANT

## 2023-10-21 NOTE — Interval H&P Note (Signed)
 History and Physical Interval Note:  10/21/2023 9:53 AM  Carl Crawford  has presented today for surgery, with the diagnosis of LLE Angio    ASO w rest pain.  The various methods of treatment have been discussed with the patient and family. After consideration of risks, benefits and other options for treatment, the patient has consented to  Procedure(s): LOWER EXTREMITY INTERVENTION (Left) Lower Extremity Angiography (Left) as a surgical intervention.  The patient's history has been reviewed, patient examined, no change in status, stable for surgery.  I have reviewed the patient's chart and labs.  Questions were answered to the patient's satisfaction.     Cordella Shawl

## 2023-10-21 NOTE — Op Note (Signed)
 [facility]  Percutaneous Study/Intervention Procedural Note   Date of Surgery: 10/21/2023  Surgeon:  Cordella JUDITHANN Shawl, MD.  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  Introduction catheter into left lower extremity 3rd order catheter placement              2.    Contrast injection left lower extremity for distal runoff             3.   Mechanical thrombectomy left SFA and above-knee popliteal using the [place] Rex thrombectomy catheter                        4.  Percutaneous transluminal angioplasty and stent placement left superficial femoral artery and popliteal             5.   Star close closure right common femoral arteriotomy  Anesthesia: Conscious sedation was administered under my direct supervision by the interventional radiology RN. IV Versed  plus fentanyl  were utilized. Continuous ECG, pulse oximetry and blood pressure was monitored throughout the entire procedure.  Conscious sedation was for a total of 57 minutes.  Sheath: 6 [place] Rabie right common femoral retrograde  Contrast: 65 cc  Fluoroscopy Time: 8.7 minutes  Indications:  Carl Crawford presents with increasing pain of his left lower extremity.  Noninvasive studies as well as physical examination demonstrate advanced atherosclerotic occlusive disease.  This places him at increased risk for limb loss angiography with hope for intervention is recommended.  The risks and benefits are reviewed all questions answered patient agrees to proceed.  Procedure:  Carl Crawford is a 63 y.o. y.o. male who was identified and appropriate procedural time out was performed.  The patient was then placed supine on the table and prepped and draped in the usual sterile fashion.    Ultrasound was placed in the sterile sleeve and the right groin was  evaluated the right common femoral artery was echolucent and pulsatile indicating patency.  Image was recorded for the permanent record and under real-time visualization a microneedle was inserted into the common femoral artery microwire followed by a micro-sheath.  A J-wire was then advanced through the micro-sheath and a  5 [place] sheath was then inserted over a J-wire. J-wire was then advanced and a 5 French pigtail catheter was positioned at the level of T12. AP projection of the aorta was then obtained. Pigtail catheter was repositioned to above the bifurcation and a RAO view of the pelvis was obtained.  Subsequently a rim catheter with the stiff angle Glidewire was used to cross the aortic bifurcation the catheter wire were advanced down into the left distal external iliac artery. Oblique view of the femoral bifurcation was then obtained and subsequently the wire was reintroduced and the pigtail catheter negotiated into the SFA representing third order catheter placement. Distal runoff was then performed.  6000 units of heparin  was then given and allowed to circulate and a 6 [place] Rabie sheath was advanced up and over the bifurcation and positioned in the femoral artery  KMP  catheter and advantage Glidewire were then negotiated down into the distal popliteal across the occluded segment.  Distal runoff was then verified by hand injection through the catheter. The V18 wire was then reintroduced through the Kumpe catheter and the  Rota Rex catheter was prepped and then advanced over the wire.  2 full passes were made through the occluded segment to extract any thrombus that was present and could potentially embolize distally.  Follow-up imaging demonstrated a successful recanalization with forward flow demonstrating greater than 75% stenosis in the distal SFA and proximal popliteal at the level of Hunter's canal.  A 7 mm x 200 mm life stent was then deployed across this lesion and postdilated with a 6 mm x  220 mm Lutonix drug-eluting balloon inflated to 10 atm for 1 full minute.  Follow-up imaging demonstrated less than 10% residual stenosis with preservation of the two-vessel distal runoff.    After review of these images the sheath is pulled into the right external iliac oblique of the common femoral is obtained and a Star close device deployed. There no immediate complications.   Findings:  The abdominal aorta is opacified with a bolus injection contrast. Renal arteries are single and patent. The aorta itself has diffuse disease but no hemodynamically significant lesions. The common and external iliac arteries are widely patent bilaterally.  The left common femoral is widely patent as is the profunda femoris.  The SFA does indeed have an occlusion distally at the level of Hunter's canal which extends into the above-knee popliteal.  The mid and distal popliteal demonstrates diffuse nonhemodynamically significant disease.  The trifurcation is patent with the posterior tibial and anterior tibial arteries patent to the foot.  Posterior tibial is slightly more dominant.  The peroneal appears to occlude distally.    Following thrombectomy with the [place] Rex catheter there is now removal of the thrombus with a patent channel.  Angioplasty and stent placement of the distal SFA and proximal popliteal at Hunter's canal yields an excellent result with less than 10% residual stenosis.    Summary: Successful recanalization left lower extremity for limb salvage                        Disposition: Patient was taken to the recovery room in stable condition having tolerated the procedure well.  Lannie Yusuf, Cordella MATSU 10/21/2023,11:24 AM

## 2023-10-22 ENCOUNTER — Encounter: Payer: Self-pay | Admitting: Vascular Surgery

## 2023-11-10 ENCOUNTER — Other Ambulatory Visit (INDEPENDENT_AMBULATORY_CARE_PROVIDER_SITE_OTHER): Payer: Self-pay | Admitting: Vascular Surgery

## 2023-11-10 DIAGNOSIS — Z9889 Other specified postprocedural states: Secondary | ICD-10-CM

## 2023-11-12 ENCOUNTER — Other Ambulatory Visit (INDEPENDENT_AMBULATORY_CARE_PROVIDER_SITE_OTHER)

## 2023-11-12 ENCOUNTER — Encounter (INDEPENDENT_AMBULATORY_CARE_PROVIDER_SITE_OTHER): Payer: Self-pay | Admitting: Vascular Surgery

## 2023-11-12 ENCOUNTER — Ambulatory Visit (INDEPENDENT_AMBULATORY_CARE_PROVIDER_SITE_OTHER): Admitting: Vascular Surgery

## 2023-11-12 VITALS — BP 133/74 | HR 69 | Resp 18 | Wt 209.8 lb

## 2023-11-12 DIAGNOSIS — Z9889 Other specified postprocedural states: Secondary | ICD-10-CM | POA: Diagnosis not present

## 2023-11-12 DIAGNOSIS — E1165 Type 2 diabetes mellitus with hyperglycemia: Secondary | ICD-10-CM | POA: Diagnosis not present

## 2023-11-12 DIAGNOSIS — I10 Essential (primary) hypertension: Secondary | ICD-10-CM | POA: Diagnosis not present

## 2023-11-12 DIAGNOSIS — I70223 Atherosclerosis of native arteries of extremities with rest pain, bilateral legs: Secondary | ICD-10-CM

## 2023-11-12 DIAGNOSIS — I739 Peripheral vascular disease, unspecified: Secondary | ICD-10-CM | POA: Diagnosis not present

## 2023-11-12 DIAGNOSIS — E782 Mixed hyperlipidemia: Secondary | ICD-10-CM

## 2023-11-12 DIAGNOSIS — E1169 Type 2 diabetes mellitus with other specified complication: Secondary | ICD-10-CM | POA: Diagnosis not present

## 2023-11-13 LAB — VAS US ABI WITH/WO TBI
Left ABI: 1.12
Right ABI: 0.75

## 2023-11-15 ENCOUNTER — Encounter (INDEPENDENT_AMBULATORY_CARE_PROVIDER_SITE_OTHER): Payer: Self-pay | Admitting: Vascular Surgery

## 2023-11-15 NOTE — Progress Notes (Signed)
 Subjective:    Patient ID: Carl Crawford, male    DOB: November 16, 1960, 63 y.o.   MRN: [id number] Chief Complaint  Patient presents with   Follow-up    [facility] 3 week with ABI    [name] is a 63 yo male who presents to clinic today 3 weeks postop from:  Procedure(s) Performed:             1.  Introduction catheter into left lower extremity 3rd order catheter placement              2.    Contrast injection left lower extremity for distal runoff             3.   Mechanical thrombectomy left SFA and above-knee popliteal using the [place] Rex thrombectomy catheter                        4.  Percutaneous transluminal angioplasty and stent placement left superficial femoral artery and popliteal             5.   Star close closure right common femoral arteriotomy  Patient endorses he is doing very well.  He has no complaints concerning his lower extremities.  He does feel a knot in his left leg but is not painful.  He does have a bone on the lateral side of his left foot that is sore that he sees podiatry for but no other complaints.  Patient states he is walking well.  There is no open sores or ulcers.  He endorses he is not having any claudication pains as he was prior to the procedure.  Patient did undergo bilateral lower extremity arterial duplex ultrasounds with ABIs today.  Right ABI today was 0.75, previous right ABI was 0.69 Left ABI today is 1.12, previous left ABI was 0.84.  Patient is noted to have biphasic waveforms in his right lower extremity and triphasic waveforms in his left lower extremities.  Both extremities have normal TBI's.    Review of Systems  Constitutional: Negative.   Musculoskeletal:  Positive for myalgias.  All other systems reviewed and are negative.      Objective:   Physical Exam Vitals reviewed.  Constitutional:      Appearance: Normal appearance. He is normal weight.  HENT:     Head: Normocephalic and atraumatic.  Eyes:     Pupils: Pupils are equal,  round, and reactive to light.  Cardiovascular:     Rate and Rhythm: Normal rate and regular rhythm.     Pulses: Normal pulses.     Heart sounds: Normal heart sounds.  Pulmonary:     Effort: Pulmonary effort is normal.     Breath sounds: Normal breath sounds.  Abdominal:     General: Abdomen is flat. Bowel sounds are normal.     Palpations: Abdomen is soft.  Musculoskeletal:        General: Normal range of motion.  Skin:    General: Skin is warm and dry.     Capillary Refill: Capillary refill takes 2 to 3 seconds.  Neurological:     General: No focal deficit present.     Mental Status: He is alert and oriented to person, place, and time. Mental status is at baseline.  Psychiatric:        Mood and Affect: Mood normal.        Behavior: Behavior normal.        Thought Content: Thought content  normal.        Judgment: Judgment normal.     BP 133/74   Pulse 69   Resp 18   Wt 209 lb 12.8 oz (95.2 kg)   BMI 27.68 kg/m   Past Medical History:  Diagnosis Date   Acid reflux disease    Diabetes mellitus without complication (HCC)    Hypertension     Social History   Socioeconomic History   Marital status: Single    Spouse name: Not on file   Number of children: Not on file   Years of education: Not on file   Highest education level: Not on file  Occupational History   Not on file  Tobacco Use   Smoking status: Never   Smokeless tobacco: Never  Vaping Use   Vaping status: Never Used  Substance and Sexual Activity   Alcohol use: Never   Drug use: Not Currently   Sexual activity: Yes  Other Topics Concern   Not on file  Social History Narrative   Not on file   Social Drivers of Health   Financial Resource Strain: Not on file  Food Insecurity: Not on file  Transportation Needs: Not on file  Physical Activity: Not on file  Stress: Not on file  Social Connections: Not on file  Intimate Partner Violence: Not on file    Past Surgical History:  Procedure  Laterality Date   COLONOSCOPY WITH PROPOFOL  N/A 05/23/2021   Procedure: COLONOSCOPY WITH PROPOFOL ;  Surgeon: Therisa Bi, MD;  Location: [facility] ENDOSCOPY;  Service: Gastroenterology;  Laterality: N/A;   LOWER EXTREMITY ANGIOGRAPHY Left 10/21/2023   Procedure: Lower Extremity Angiography;  Surgeon: Jama Cordella MATSU, MD;  Location: [facility] INVASIVE CV LAB;  Service: Cardiovascular;  Laterality: Left;   LOWER EXTREMITY INTERVENTION Left 10/21/2023   Procedure: LOWER EXTREMITY INTERVENTION;  Surgeon: Jama Cordella MATSU, MD;  Location: [facility] INVASIVE CV LAB;  Service: Cardiovascular;  Laterality: Left;    Family History  Problem Relation Age of Onset   Heart attack Father    Cancer Sister    Cirrhosis Sister    Heart attack Maternal Grandfather     No Known Allergies     Latest Ref Rng & Units 07/18/2023    5:24 PM 07/17/2023   11:55 AM 08/29/2022   12:43 PM  CBC  WBC 4.0 - 10.5 K/uL 6.3  6.6  5.6   Hemoglobin 13.0 - 17.0 g/dL 83.8  85.3  83.7   Hematocrit 39.0 - 52.0 % 44.4  41.0  44.6   Platelets 150 - 400 K/uL 277  259  262       CMP     Component Value Date/Time   NA 136 09/03/2023 1057   K 4.2 09/03/2023 1057   CL 100 09/03/2023 1057   CO2 19 (L) 09/03/2023 1057   GLUCOSE 154 (H) 09/03/2023 1057   GLUCOSE 120 (H) 07/18/2023 1724   BUN 13 10/21/2023 0934   BUN 11 09/03/2023 1057   CREATININE 0.88 10/21/2023 0934   CALCIUM  9.7 09/03/2023 1057   PROT 7.4 09/03/2023 1057   ALBUMIN 4.7 09/03/2023 1057   AST 49 (H) 09/03/2023 1057   ALT 71 (H) 09/03/2023 1057   ALKPHOS 72 09/03/2023 1057   BILITOT 0.5 09/03/2023 1057   EGFR 90 09/03/2023 1057   GFRNONAA >60 10/21/2023 0934     VAS US  ABI WITH/WO TBI Result Date: 11/13/2023  LOWER EXTREMITY DOPPLER STUDY Patient Name:  Carl Crawford Saner  Date of Exam:  11/12/2023 Medical Rec #: [id number]      Accession #:    [id number] Date of Birth: 1960-03-31      Patient Gender: M Patient Age:   13 years Exam Location:  [facility] Procedure:      VAS US  ABI WITH/WO TBI Referring Phys: Cordella Shawl --------------------------------------------------------------------------------  Indications: Claudication, rest pain, and peripheral artery disease. 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 PTA and Stent placement Left SFA and Popliteal                         Artery. Star close closure Rt Common Femoral                         Arteriotomy. Performing Technologist: Leafy Gibes RVS  Examination Guidelines: A complete evaluation includes at minimum, Doppler waveform signals and systolic blood pressure reading at the level of bilateral brachial, anterior tibial, and posterior tibial arteries, when vessel segments are accessible. Bilateral testing is considered an integral part of a complete examination. Photoelectric Plethysmograph (PPG) waveforms and toe systolic pressure readings are included as required and additional duplex testing as needed. Limited examinations for reoccurring indications may be performed as noted.  ABI Findings: +---------+------------------+-----+--------+--------+ Right    Rt Pressure (mmHg)IndexWaveformComment  +---------+------------------+-----+--------+--------+ Brachial 129                                     +---------+------------------+-----+--------+--------+ ATA      93                0.69 biphasic         +---------+------------------+-----+--------+--------+ PTA      100               0.75 biphasic         +---------+------------------+-----+--------+--------+ Great Toe121               0.90 Normal           +---------+------------------+-----+--------+--------+ +---------+------------------+-----+---------+-------+ Left     Lt Pressure (mmHg)IndexWaveform  Comment +---------+------------------+-----+---------+-------+ Brachial 134                                     +---------+------------------+-----+---------+-------+ ATA      129               0.96 triphasic        +---------+------------------+-----+---------+-------+ PTA      150               1.12 triphasic        +---------+------------------+-----+---------+-------+ Great Toe130               0.97 Normal           +---------+------------------+-----+---------+-------+ +-------+-----------+-----------+------------+------------+ ABI/TBIToday's ABIToday's TBIPrevious ABIPrevious TBI +-------+-----------+-----------+------------+------------+ Right  .75        .  90        .69         .53          +-------+-----------+-----------+------------+------------+ Left   1.12       .97        .84         .47          +-------+-----------+-----------+------------+------------+  Left ABIs and TBIs appear increased compared to prior study on 10/13/2023. Right ABIs and TBIs appear increased compared to prior study on 10/13/2023.  Summary: Right: Resting right ankle-brachial index indicates moderate right lower extremity arterial disease. The right toe-brachial index is normal.  Left: Resting left ankle-brachial index is within normal range. The left toe-brachial index is normal.  *See table(s) above for measurements and observations.  Electronically signed by Cordella Shawl MD on 11/13/2023 at 11:54:22 AM.    Final    VAS US  ABI WITH/WO TBI Result Date: 10/15/2023  LOWER EXTREMITY DOPPLER STUDY Patient Name:  RENSO SWETT  Date of Exam:   10/13/2023 Medical Rec #: [id number]      Accession #:    [id number] Date of Birth: [birth year]      Patient Gender: M Patient Age:   41 years Exam Location:  [facility] Procedure:      VAS US  ABI WITH/WO TBI Referring Phys: --------------------------------------------------------------------------------  Indications: Claudication, and  rest pain. confirmed Rt SFA occluded on venous              study  Performing Technologist: Jerel Croak RVT  Examination Guidelines: A complete evaluation includes at minimum, Doppler waveform signals and systolic blood pressure reading at the level of bilateral brachial, anterior tibial, and posterior tibial arteries, when vessel segments are accessible. Bilateral testing is considered an integral part of a complete examination. Photoelectric Plethysmograph (PPG) waveforms and toe systolic pressure readings are included as required and additional duplex testing as needed. Limited examinations for reoccurring indications may be performed as noted.  ABI Findings: +---------+------------------+-----+----------+--------+ Right    Rt Pressure (mmHg)IndexWaveform  Comment  +---------+------------------+-----+----------+--------+ Brachial 139                                       +---------+------------------+-----+----------+--------+ PTA      98                0.69 monophasic         +---------+------------------+-----+----------+--------+ DP       95                0.67 biphasic           +---------+------------------+-----+----------+--------+ Great Toe75                0.53 Normal             +---------+------------------+-----+----------+--------+ +---------+------------------+-----+----------+-------+ Left     Lt Pressure (mmHg)IndexWaveform  Comment +---------+------------------+-----+----------+-------+ Brachial 142                                      +---------+------------------+-----+----------+-------+ PTA      119               0.84 monophasic        +---------+------------------+-----+----------+-------+ DP       117  0.82 monophasic        +---------+------------------+-----+----------+-------+ Great Toe67                0.47 Normal            +---------+------------------+-----+----------+-------+  Summary: Right: Resting right  ankle-brachial index indicates moderate right lower extremity arterial disease. The right toe-brachial index is abnormal.  Left: Resting left ankle-brachial index indicates mild left lower extremity arterial disease. The left toe-brachial index is abnormal.  *See table(s) above for measurements and observations.  Electronically signed by Cordella Shawl MD on 10/15/2023 at 8:15:43 AM.    Final        Assessment & Plan:   1. Atherosclerosis of native artery of both lower extremities with rest pain (HCC) (Primary)  Recommend:  The patient has evidence of atherosclerosis of the lower extremities with claudication.  The patient does not voice lifestyle limiting changes at this point in time.  Noninvasive studies do not suggest clinically significant change.  No invasive studies, angiography or surgery at this time The patient should continue walking and begin a more formal exercise program.  The patient should continue antiplatelet therapy and aggressive treatment of the lipid abnormalities  No changes in the patient's medications at this time  Continued surveillance is indicated as atherosclerosis is likely to progress with time.    The patient will continue follow up with noninvasive studies as ordered.   2. Primary hypertension Continue antihypertensive medications as already ordered, these medications have been reviewed and there are no changes at this time.  3. Type 2 diabetes mellitus with hyperglycemia, without long-term current use of insulin (HCC) Continue hypoglycemic medications as already ordered, these medications have been reviewed and there are no changes at this time.  Hgb A1C to be monitored as already arranged by primary service  4. Mixed diabetic hyperlipidemia associated with type 2 diabetes mellitus (HCC) Continue statin as ordered and reviewed, no changes at this time   Current Outpatient Medications on File Prior to Visit  Medication Sig Dispense Refill    albuterol  (VENTOLIN  HFA) 108 (90 Base) MCG/ACT inhaler Inhale 2 puffs into the lungs every 6 (six) hours as needed for wheezing or shortness of breath. 8 g 2   apixaban  (ELIQUIS ) 5 MG TABS tablet TAKE 1 TABLET BY MOUTH TWICE A DAY 90 tablet 1   aspirin  EC 81 MG tablet Take 1 tablet (81 mg total) by mouth daily. Swallow whole. 150 tablet 1   empagliflozin  (JARDIANCE ) 10 MG TABS tablet Take 1 tablet (10 mg total) by mouth daily. 90 tablet 0   esomeprazole (NEXIUM) 20 MG capsule Take 20 mg by mouth daily at 12 noon.     hydrochlorothiazide  (HYDRODIURIL ) 25 MG tablet Take 1 tablet (25 mg total) by mouth daily. 90 tablet 1   ketoconazole  (NIZORAL ) 2 % cream Apply 1 Application topically daily. 60 g 1   losartan  (COZAAR ) 100 MG tablet Take 1 tablet (100 mg total) by mouth daily. 90 tablet 1   rosuvastatin  (CRESTOR ) 20 MG tablet TAKE 1 TABLET BY MOUTH EVERY DAY 90 tablet 3   Spacer/Aero-Holding Chambers (AEROCHAMBER MV) inhaler Use as instructed 1 each 0   triamcinolone  cream (KENALOG ) 0.1 % Apply 1 application. topically 2 (two) times daily. On areas that itch 45 g 2   Blood Glucose Monitoring Suppl DEVI 1 each by Does not apply route daily before breakfast. May substitute to any manufacturer covered by patient's insurance. (Patient not taking: Reported on 11/12/2023) 1 each 0  cyanocobalamin  (VITAMIN B12) 500 MCG tablet Take 1 tablet (500 mcg total) by mouth daily. (Patient not taking: Reported on 11/12/2023) 90 tablet 1   Glucose Blood (BLOOD GLUCOSE TEST STRIPS) STRP 1 each by In Vitro route daily before breakfast. May substitute to any manufacturer covered by patient's insurance. (Patient not taking: Reported on 11/12/2023) 100 strip 3   Multiple Vitamins-Minerals (MENS MULTIVITAMIN PO) Take by mouth. (Patient not taking: Reported on 11/12/2023)     No current facility-administered medications on file prior to visit.    There are no Patient Instructions on file for this visit. No follow-ups on  file.   Gwendlyn JONELLE Shank, NP

## 2023-12-04 ENCOUNTER — Telehealth: Payer: Self-pay

## 2023-12-04 NOTE — Telephone Encounter (Signed)
 Please advise.   Copied from CRM [id number]. Topic: Clinical - Medical Advice >> Dec 04, 2023 10:26 AM China J wrote: Reason for CRM: The patient is wondering if he needs to continue taking apixaban  (ELIQUIS ) 5 MG TABS tablet.  Please call back at  [phone] as he is aware of the same day turn around time.

## 2023-12-04 NOTE — Telephone Encounter (Signed)
 Copied from CRM [id number]. Topic: Clinical - Medical Advice >> Dec 04, 2023 10:26 AM China J wrote: Reason for CRM: The patient is wondering if he needs to continue taking apixaban  (ELIQUIS ) 5 MG TABS tablet.  Please call back at  [phone] as he is aware of the same day turn around time. >> Dec 04, 2023 12:40 PM Emylou G wrote: Patient called.SABRA adv him we will call him back about msg

## 2023-12-09 NOTE — Progress Notes (Signed)
 Pharmacy Quality Measure Review  This patient is appearing on a report for being at risk of failing the adherence measure for cholesterol (statin) medications this calendar year.   Medication: rosuvastatin  Last fill date: 12/02/23 for 90 day supply  Insurance report was not up to date. No action needed at this time.   Loisann Roach E. Marsh, PharmD, CPP Clinical Pharmacist [facility] [phone]

## 2023-12-16 ENCOUNTER — Other Ambulatory Visit (INDEPENDENT_AMBULATORY_CARE_PROVIDER_SITE_OTHER): Payer: Self-pay | Admitting: Vascular Surgery

## 2023-12-16 DIAGNOSIS — I8001 Phlebitis and thrombophlebitis of superficial vessels of right lower extremity: Secondary | ICD-10-CM

## 2023-12-17 ENCOUNTER — Encounter: Payer: Self-pay | Admitting: Family Medicine

## 2023-12-17 ENCOUNTER — Ambulatory Visit (INDEPENDENT_AMBULATORY_CARE_PROVIDER_SITE_OTHER): Admitting: Family Medicine

## 2023-12-17 ENCOUNTER — Ambulatory Visit (INDEPENDENT_AMBULATORY_CARE_PROVIDER_SITE_OTHER): Admitting: Vascular Surgery

## 2023-12-17 ENCOUNTER — Other Ambulatory Visit (INDEPENDENT_AMBULATORY_CARE_PROVIDER_SITE_OTHER)

## 2023-12-17 ENCOUNTER — Encounter (INDEPENDENT_AMBULATORY_CARE_PROVIDER_SITE_OTHER): Payer: Self-pay | Admitting: Vascular Surgery

## 2023-12-17 ENCOUNTER — Ambulatory Visit

## 2023-12-17 VITALS — BP 147/80 | HR 75 | Temp 97.7°F | Ht 73.0 in | Wt 209.5 lb

## 2023-12-17 VITALS — BP 158/83 | HR 71 | Resp 18 | Wt 209.5 lb

## 2023-12-17 DIAGNOSIS — B353 Tinea pedis: Secondary | ICD-10-CM | POA: Diagnosis not present

## 2023-12-17 DIAGNOSIS — I10 Essential (primary) hypertension: Secondary | ICD-10-CM

## 2023-12-17 DIAGNOSIS — Z820 Family history of epilepsy and other diseases of the nervous system: Secondary | ICD-10-CM | POA: Diagnosis not present

## 2023-12-17 DIAGNOSIS — E1165 Type 2 diabetes mellitus with hyperglycemia: Secondary | ICD-10-CM

## 2023-12-17 DIAGNOSIS — I8001 Phlebitis and thrombophlebitis of superficial vessels of right lower extremity: Secondary | ICD-10-CM

## 2023-12-17 DIAGNOSIS — E782 Mixed hyperlipidemia: Secondary | ICD-10-CM | POA: Diagnosis not present

## 2023-12-17 DIAGNOSIS — Z7984 Long term (current) use of oral hypoglycemic drugs: Secondary | ICD-10-CM | POA: Diagnosis not present

## 2023-12-17 DIAGNOSIS — M79672 Pain in left foot: Secondary | ICD-10-CM | POA: Diagnosis not present

## 2023-12-17 DIAGNOSIS — Z0000 Encounter for general adult medical examination without abnormal findings: Secondary | ICD-10-CM

## 2023-12-17 DIAGNOSIS — E1169 Type 2 diabetes mellitus with other specified complication: Secondary | ICD-10-CM

## 2023-12-17 DIAGNOSIS — Z0001 Encounter for general adult medical examination with abnormal findings: Secondary | ICD-10-CM | POA: Diagnosis not present

## 2023-12-17 DIAGNOSIS — I70223 Atherosclerosis of native arteries of extremities with rest pain, bilateral legs: Secondary | ICD-10-CM | POA: Diagnosis not present

## 2023-12-17 MED ORDER — APIXABAN 5 MG PO TABS: 5.0000 mg | ORAL_TABLET | Freq: Two times a day (BID) | ORAL | 6 refills | Status: DC

## 2023-12-17 MED ORDER — EMPAGLIFLOZIN 10 MG PO TABS: 10.0000 mg | ORAL_TABLET | Freq: Every day | ORAL | 1 refills | Status: AC

## 2023-12-17 MED ORDER — LOSARTAN POTASSIUM-HCTZ 100-25 MG PO TABS: 1.0000 | ORAL_TABLET | Freq: Every day | ORAL | 1 refills | Status: AC

## 2023-12-17 MED ORDER — KETOCONAZOLE 2 % EX CREA
1.0000 | TOPICAL_CREAM | Freq: Every day | CUTANEOUS | 0 refills | Status: AC
Start: 2023-12-17 — End: ?

## 2023-12-17 MED ORDER — AMLODIPINE BESYLATE 5 MG PO TABS: 5.0000 mg | ORAL_TABLET | Freq: Every day | ORAL | 0 refills | Status: AC

## 2023-12-17 NOTE — Patient Instructions (Addendum)
 Recommend you take: cyanocobalamin  (VITAMIN B12) 500 MCG tablet  Daily   Carl Crawford,  Thank you for taking the time for your Medicare Wellness Visit. I appreciate your continued commitment to your health goals. Please review the care plan we discussed, and feel free to reach out if I can assist you further.  Please note that Annual Wellness Visits do not include a physical exam. Some assessments may be limited, especially if the visit was conducted virtually. If needed, we may recommend an in-person follow-up with your provider.  Ongoing Care Seeing your primary care provider every 3 to 6 months helps us  monitor your health and provide consistent, personalized care.   Referrals If a referral was made during today's visit and you haven't received any updates within two weeks, please contact the referred provider directly to check on the status.  Recommended Screenings:  Health Maintenance  Topic Date Due   Medicare Annual Wellness Visit  Never done   Zoster (Shingles) Vaccine (1 of 2) 03/18/2024*   Flu Shot  04/27/2024*   Pneumococcal Vaccine for age over 70 (1 of 2 - PCV) 09/02/2024*   Hepatitis C Screening  09/02/2024*   HIV Screening  09/02/2024*   COVID-19 Vaccine (4 - 2025-26 season) 09/28/2024*   Hemoglobin A1C  03/05/2024   Colon Cancer Screening  05/23/2024   Eye exam for diabetics  08/07/2024   Yearly kidney health urinalysis for diabetes  09/02/2024   Complete foot exam   09/02/2024   DTaP/Tdap/Td vaccine (2 - Td or Tdap) 10/10/2024   Yearly kidney function blood test for diabetes  10/20/2024   Hepatitis B Vaccine  Aged Out   Meningitis B Vaccine  Aged Out  *Topic was postponed. The date shown is not the original due date.       12/17/2023    2:28 PM  Advanced Directives  Does Patient Have a Medical Advance Directive? No  Would patient like information on creating a medical advance directive? Yes (Inpatient - patient defers creating a medical advance directive and  declines information at this time)    Vision: Annual vision screenings are recommended for early detection of glaucoma, cataracts, and diabetic retinopathy. These exams can also reveal signs of chronic conditions such as diabetes and high blood pressure.  Dental: Annual dental screenings help detect early signs of oral cancer, gum disease, and other conditions linked to overall health, including heart disease and diabetes.  Please see the attached documents for additional preventive care recommendations.

## 2023-12-17 NOTE — Progress Notes (Signed)
 Subjective:    Patient ID: Carl Crawford, male    DOB: 12/25/1960, 63 y.o.   MRN: [id number] No chief complaint on file.   Kristofer Schaffert is a 63 year old gentleman who returns to clinic today for evaluation of his bilateral lower extremities.  Back in August he had a phlebitis to his right lower extremity which created swelling and pain.  At that time in the emergency room he was placed on Eliquis  5 mg twice daily.  Patient followed up with his primary care physician and at that visit it was found that he had run out of refills and therefore he been off Eliquis  for the past month.  There does not seem to be an extension of the clot on the venous ultrasounds today but he continues to have a right greater saphenous vein thrombus with phlebitis.  He denies any pain to his right lower extremity.  He states his right lower extremity feels good.  The only pain he is complaining about today is to his left foot.  He has a bony prominence in the middle to the lateral side of his left foot that he would like addressed.  Otherwise no other complaints.    Review of Systems  Constitutional: Negative.   Cardiovascular:  Positive for leg swelling.  Musculoskeletal:  Positive for myalgias.  All other systems reviewed and are negative.      Objective:   Physical Exam Constitutional:      Appearance: Normal appearance. He is normal weight.  HENT:     Head: Normocephalic.  Eyes:     Pupils: Pupils are equal, round, and reactive to light.  Cardiovascular:     Rate and Rhythm: Normal rate and regular rhythm.     Pulses: Normal pulses.     Heart sounds: Normal heart sounds.  Pulmonary:     Effort: Pulmonary effort is normal.     Breath sounds: Normal breath sounds.  Abdominal:     General: Abdomen is flat. Bowel sounds are normal.     Palpations: Abdomen is soft.  Musculoskeletal:        General: Tenderness present. Normal range of motion.     Comments: Tenderness to his mid left foot on the lateral  side.  He has a bony prominence but no skin breakdown or ulceration or vascular insufficiency to this area.  Skin:    General: Skin is warm and dry.     Capillary Refill: Capillary refill takes 2 to 3 seconds.  Neurological:     General: No focal deficit present.     Mental Status: He is alert and oriented to person, place, and time. Mental status is at baseline.  Psychiatric:        Mood and Affect: Mood normal.        Behavior: Behavior normal.        Thought Content: Thought content normal.        Judgment: Judgment normal.     There were no vitals taken for this visit.  Past Medical History:  Diagnosis Date   Acid reflux disease    Diabetes mellitus without complication (HCC)    Hypertension     Social History   Socioeconomic History   Marital status: Single    Spouse name: Not on file   Number of children: Not on file   Years of education: Not on file   Highest education level: Not on file  Occupational History   Not on file  Tobacco Use  Smoking status: Never   Smokeless tobacco: Never  Vaping Use   Vaping status: Never Used  Substance and Sexual Activity   Alcohol use: Never   Drug use: Not Currently   Sexual activity: Yes  Other Topics Concern   Not on file  Social History Narrative   Not on file   Social Drivers of Health   Financial Resource Strain: Low Risk  (12/17/2023)   Overall Financial Resource Strain (CARDIA)    Difficulty of Paying Living Expenses: Not hard at all  Food Insecurity: No Food Insecurity (12/17/2023)   Hunger Vital Sign    Worried About Running Out of Food in the Last Year: Never true    Ran Out of Food in the Last Year: Never true  Transportation Needs: No Transportation Needs (12/17/2023)   PRAPARE - Administrator, Civil Service (Medical): No    Lack of Transportation (Non-Medical): No  Physical Activity: Sufficiently Active (12/17/2023)   Exercise Vital Sign    Days of Exercise per Week: 5 days    Minutes  of Exercise per Session: 150+ min  Stress: No Stress Concern Present (12/17/2023)   [employer] of Occupational Health - Occupational Stress Questionnaire    Feeling of Stress: Only a little  Social Connections: Moderately Isolated (12/17/2023)   Social Connection and Isolation Panel    Frequency of Communication with Friends and Family: Three times a week    Frequency of Social Gatherings with Friends and Family: Never    Attends Religious Services: More than 4 times per year    Active Member of Clubs or Organizations: No    Attends Banker Meetings: Never    Marital Status: Never married  Intimate Partner Violence: Not At Risk (12/17/2023)   Humiliation, Afraid, Rape, and Kick questionnaire    Fear of Current or Ex-Partner: No    Emotionally Abused: No    Physically Abused: No    Sexually Abused: No    Past Surgical History:  Procedure Laterality Date   COLONOSCOPY WITH PROPOFOL  N/A 05/23/2021   Procedure: COLONOSCOPY WITH PROPOFOL ;  Surgeon: Therisa Bi, MD;  Location: [facility] ENDOSCOPY;  Service: Gastroenterology;  Laterality: N/A;   LOWER EXTREMITY ANGIOGRAPHY Left 10/21/2023   Procedure: Lower Extremity Angiography;  Surgeon: Jama Cordella MATSU, MD;  Location: [facility] INVASIVE CV LAB;  Service: Cardiovascular;  Laterality: Left;   LOWER EXTREMITY INTERVENTION Left 10/21/2023   Procedure: LOWER EXTREMITY INTERVENTION;  Surgeon: Jama Cordella MATSU, MD;  Location: [facility] INVASIVE CV LAB;  Service: Cardiovascular;  Laterality: Left;    Family History  Problem Relation Age of Onset   Heart attack Father    Cancer Sister    Cirrhosis Sister    Heart attack Maternal Grandfather     No Known Allergies     Latest Ref Rng & Units 07/18/2023    5:24 PM 07/17/2023   11:55 AM 08/29/2022   12:43 PM  CBC  WBC 4.0 - 10.5 K/uL 6.3  6.6  5.6   Hemoglobin 13.0 - 17.0 g/dL 83.8  85.3  83.7   Hematocrit 39.0 - 52.0 % 44.4  41.0  44.6   Platelets 150 - 400 K/uL 277  259  262        CMP     Component Value Date/Time   NA 136 09/03/2023 1057   K 4.2 09/03/2023 1057   CL 100 09/03/2023 1057   CO2 19 (L) 09/03/2023 1057   GLUCOSE 154 (H) 09/03/2023 1057  GLUCOSE 120 (H) 07/18/2023 1724   BUN 13 10/21/2023 0934   BUN 11 09/03/2023 1057   CREATININE 0.88 10/21/2023 0934   CALCIUM  9.7 09/03/2023 1057   PROT 7.4 09/03/2023 1057   ALBUMIN 4.7 09/03/2023 1057   AST 49 (H) 09/03/2023 1057   ALT 71 (H) 09/03/2023 1057   ALKPHOS 72 09/03/2023 1057   BILITOT 0.5 09/03/2023 1057   EGFR 90 09/03/2023 1057   GFRNONAA >60 10/21/2023 0934     VAS US  ABI WITH/WO TBI Result Date: 11/13/2023  LOWER EXTREMITY DOPPLER STUDY Patient Name:  JAHMEL FLANNAGAN  Date of Exam:   11/12/2023 Medical Rec #: [id number]      Accession #:    [id number] Date of Birth: 1960-08-18      Patient Gender: M Patient Age:   10 years Exam Location:  [facility] Procedure:      VAS US  ABI WITH/WO TBI Referring Phys: Cordella Shawl --------------------------------------------------------------------------------  Indications: Claudication, rest pain, and peripheral artery disease. 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 PTA and Stent placement Left SFA and Popliteal                         Artery. Star close closure Rt Common Femoral                         Arteriotomy. Performing Technologist: Leafy Gibes RVS  Examination Guidelines: A complete evaluation includes at minimum, Doppler waveform signals and systolic blood pressure reading at the level of bilateral brachial, anterior tibial, and posterior tibial arteries, when vessel segments are accessible. Bilateral testing is considered an integral part of a complete examination. Photoelectric Plethysmograph (PPG) waveforms and toe systolic  pressure readings are included as required and additional duplex testing as needed. Limited examinations for reoccurring indications may be performed as noted.  ABI Findings: +---------+------------------+-----+--------+--------+ Right    Rt Pressure (mmHg)IndexWaveformComment  +---------+------------------+-----+--------+--------+ Brachial 129                                     +---------+------------------+-----+--------+--------+ ATA      93                0.69 biphasic         +---------+------------------+-----+--------+--------+ PTA      100               0.75 biphasic         +---------+------------------+-----+--------+--------+ Great Toe121               0.90 Normal           +---------+------------------+-----+--------+--------+ +---------+------------------+-----+---------+-------+ Left     Lt Pressure (mmHg)IndexWaveform Comment +---------+------------------+-----+---------+-------+ Brachial 134                                     +---------+------------------+-----+---------+-------+  ATA      129               0.96 triphasic        +---------+------------------+-----+---------+-------+ PTA      150               1.12 triphasic        +---------+------------------+-----+---------+-------+ Great Toe130               0.97 Normal           +---------+------------------+-----+---------+-------+ +-------+-----------+-----------+------------+------------+ ABI/TBIToday's ABIToday's TBIPrevious ABIPrevious TBI +-------+-----------+-----------+------------+------------+ Right  .75        .90        .69         .53          +-------+-----------+-----------+------------+------------+ Left   1.12       .97        .84         .47          +-------+-----------+-----------+------------+------------+  Left ABIs and TBIs appear increased compared to prior study on 10/13/2023. Right ABIs and TBIs appear increased compared to prior study on  10/13/2023.  Summary: Right: Resting right ankle-brachial index indicates moderate right lower extremity arterial disease. The right toe-brachial index is normal.  Left: Resting left ankle-brachial index is within normal range. The left toe-brachial index is normal.  *See table(s) above for measurements and observations.  Electronically signed by Cordella Shawl MD on 11/13/2023 at 11:54:22 AM.    Final        Assessment & Plan:   1. Superficial thrombophlebitis of right leg (Primary) The patient had a very symptomatic superficial phlebitis of the right lower extremity. He was placed on Eliquis  in the emergency room.  He returns to clinic today for evaluation of his bilateral lower extremities via venous duplex ultrasounds.  Back in August he was noted to have a DVT to his right saphenous vein with no DVT noted to the left lower extremity.  Today the findings are consistent with acute superficial vein thrombosis involving the right greater saphenous vein.  There is no evidence of deep vein thrombosis but the right greater saphenous vein mid to distal calf area appears to display signs of an acute process of thrombophlebitis.  Therefore I recommend that the patient continue to be on Eliquis  5 mg twice daily for at least a year.  Patient was in agreement with the plan.  I have placed an order in the system to renew his Eliquis  with 6 refills.  2. Primary hypertension Continue antihypertensive medications as already ordered, these medications have been reviewed and there are no changes at this time.   3. Type 2 diabetes mellitus with hyperglycemia, without long-term current use of insulin (HCC) Continue hypoglycemic medications as already ordered, these medications have been reviewed and there are no changes at this time.   Hgb A1C to be monitored as already arranged by primary service   Current Outpatient Medications on File Prior to Visit  Medication Sig Dispense Refill   albuterol  (VENTOLIN  HFA)  108 (90 Base) MCG/ACT inhaler Inhale 2 puffs into the lungs every 6 (six) hours as needed for wheezing or shortness of breath. 8 g 2   amLODipine (NORVASC) 5 MG tablet Take 1 tablet (5 mg total) by mouth daily. 90 tablet 0   apixaban  (ELIQUIS ) 5 MG TABS tablet TAKE 1 TABLET BY MOUTH TWICE A DAY 90 tablet 1   aspirin  EC 81 MG tablet Take 1  tablet (81 mg total) by mouth daily. Swallow whole. 150 tablet 1   cyanocobalamin  (VITAMIN B12) 500 MCG tablet Take 1 tablet (500 mcg total) by mouth daily. (Patient not taking: Reported on 12/17/2023) 90 tablet 1   empagliflozin  (JARDIANCE ) 10 MG TABS tablet Take 1 tablet (10 mg total) by mouth daily. 90 tablet 1   esomeprazole (NEXIUM) 20 MG capsule Take 20 mg by mouth daily at 12 noon.     ketoconazole  (NIZORAL ) 2 % cream Apply 1 Application topically daily. To left foot between toes (especially between 4th and 5th toes) for two weeks or until infection resolves. 60 g 0   losartan -hydrochlorothiazide  (HYZAAR) 100-25 MG tablet Take 1 tablet by mouth daily. 90 tablet 1   rosuvastatin  (CRESTOR ) 20 MG tablet TAKE 1 TABLET BY MOUTH EVERY DAY 90 tablet 3   Spacer/Aero-Holding Chambers (AEROCHAMBER MV) inhaler Use as instructed 1 each 0   triamcinolone  cream (KENALOG ) 0.1 % Apply 1 application. topically 2 (two) times daily. On areas that itch 45 g 2   No current facility-administered medications on file prior to visit.    There are no Patient Instructions on file for this visit. No follow-ups on file.   Gwendlyn JONELLE Shank, NP

## 2023-12-17 NOTE — Progress Notes (Signed)
 Complete physical exam   Patient: Carl Crawford   DOB: September 01, 1960   63 y.o. Male  MRN: [id number] Visit Date: 12/17/2023  Today's healthcare provider: LAURAINE LOISE BUOY, DO   Chief Complaint  Patient presents with   Annual Exam    Diet- General Exercise- No Overall feeling- Alright Sleep- Pretty good Concerns- Left foot has a sore, knot on the side hurts when he walks   Subjective    Carl Crawford is a 63 y.o. male who presents today for a complete physical exam.   HPI HPI     Annual Exam    Additional comments: Diet- General Exercise- No Overall feeling- Alright Sleep- Pretty good Concerns- Left foot has a sore, knot on the side hurts when he walks      Last edited by Terrel Powell CROME, CMA on 12/17/2023  1:41 PM.      Carl Crawford is a 63 year old male who presents for an annual physical exam and evaluation of a sore on his left foot.  He has a sore on the left side of his foot, which is painful when walking. He has been applying a cream daily, believing it to be for a fungal infection. No recent foot injury is noted.  He is scheduled for an ultrasound of both legs today due to leg soreness and a history of blood clots. He previously had a stent placed in his leg but is unsure if it resolved his symptoms. He has not taken Eliquis  for about a month and is uncertain about the duration of its use, recalling the dosage as 5 mg twice a day.  He is not currently checking his blood sugars but takes his diabetes medication, Jardiance , regularly at night before bed. He feels fine except for the leg soreness.  He takes losartan , hydrochlorothiazide  and amlodipine .  He takes all of his medications at night before bed. He reports a little coughing. He had an eye exam on July 15th.  He is concerned about memory issues, particularly with remembering addresses, and mentions a family history of dementia, as his grandmother had it. He is unsure if he has any memory loss but  is concerned about it.     Past Medical History:  Diagnosis Date   Acid reflux disease    Diabetes mellitus without complication (HCC)    Hypertension    Past Surgical History:  Procedure Laterality Date   COLONOSCOPY WITH PROPOFOL  N/A 05/23/2021   Procedure: COLONOSCOPY WITH PROPOFOL ;  Surgeon: Therisa Bi, MD;  Location: [facility] ENDOSCOPY;  Service: Gastroenterology;  Laterality: N/A;   LOWER EXTREMITY ANGIOGRAPHY Left 10/21/2023   Procedure: Lower Extremity Angiography;  Surgeon: Jama Cordella MATSU, MD;  Location: [facility] INVASIVE CV LAB;  Service: Cardiovascular;  Laterality: Left;   LOWER EXTREMITY INTERVENTION Left 10/21/2023   Procedure: LOWER EXTREMITY INTERVENTION;  Surgeon: Jama Cordella MATSU, MD;  Location: [facility] INVASIVE CV LAB;  Service: Cardiovascular;  Laterality: Left;   Social History   Socioeconomic History   Marital status: Single    Spouse name: Not on file   Number of children: Not on file   Years of education: Not on file   Highest education level: Not on file  Occupational History   Not on file  Tobacco Use   Smoking status: Never   Smokeless tobacco: Never  Vaping Use   Vaping status: Never Used  Substance and Sexual Activity   Alcohol use: Never   Drug use: Not  Currently   Sexual activity: Yes  Other Topics Concern   Not on file  Social History Narrative   Not on file   Social Drivers of Health   Tobacco Use: Low Risk (12/17/2023)   Patient History    Smoking Tobacco Use: Never    Smokeless Tobacco Use: Never    Passive Exposure: Not on file  Financial Resource Strain: Low Risk (12/17/2023)   Overall Financial Resource Strain (CARDIA)    Difficulty of Paying Living Expenses: Not hard at all  Food Insecurity: No Food Insecurity (12/17/2023)   Epic    Worried About Programme Researcher, Broadcasting/film/video in the Last Year: Never true    Ran Out of Food in the Last Year: Never true  Transportation Needs: No Transportation Needs (12/17/2023)   Epic    Lack of  Transportation (Medical): No    Lack of Transportation (Non-Medical): No  Physical Activity: Sufficiently Active (12/17/2023)   Exercise Vital Sign    Days of Exercise per Week: 5 days    Minutes of Exercise per Session: 150+ min  Stress: No Stress Concern Present (12/17/2023)   [employer] of Occupational Health - Occupational Stress Questionnaire    Feeling of Stress: Only a little  Social Connections: Moderately Isolated (12/17/2023)   Social Connection and Isolation Panel    Frequency of Communication with Friends and Family: Three times a week    Frequency of Social Gatherings with Friends and Family: Never    Attends Religious Services: More than 4 times per year    Active Member of Clubs or Organizations: No    Attends Banker Meetings: Never    Marital Status: Never married  Intimate Partner Violence: Not At Risk (12/17/2023)   Epic    Fear of Current or Ex-Partner: No    Emotionally Abused: No    Physically Abused: No    Sexually Abused: No  Depression (PHQ2-9): High Risk (12/17/2023)   Depression (PHQ2-9)    PHQ-2 Score: 11  Alcohol Screen: Low Risk (12/17/2023)   Alcohol Screen    Last Alcohol Screening Score (AUDIT): 0  Housing: Low Risk (12/17/2023)   Epic    Unable to Pay for Housing in the Last Year: No    Number of Times Moved in the Last Year: 0    Homeless in the Last Year: No  Utilities: Not At Risk (12/17/2023)   Epic    Threatened with loss of utilities: No  Health Literacy: Inadequate Health Literacy (12/17/2023)   B1300 Health Literacy    Frequency of need for help with medical instructions: Sometimes   Family Status  Relation Name Status   Father  (Not Specified)   Sister  (Not Specified)   MGF  (Not Specified)  No partnership data on file   Family History  Problem Relation Age of Onset   Heart attack Father    Cancer Sister    Cirrhosis Sister    Heart attack Maternal Grandfather    No Known Allergies  Patient Care  Team: Beckett Hickmon N, DO as PCP - General (Family Medicine)   Medications: Outpatient Medications Prior to Visit  Medication Sig Note   albuterol  (VENTOLIN  HFA) 108 (90 Base) MCG/ACT inhaler Inhale 2 puffs into the lungs every 6 (six) hours as needed for wheezing or shortness of breath.    apixaban  (ELIQUIS ) 5 MG TABS tablet TAKE 1 TABLET BY MOUTH TWICE A DAY    aspirin  EC 81 MG tablet Take 1 tablet (81  mg total) by mouth daily. Swallow whole.    esomeprazole (NEXIUM) 20 MG capsule Take 20 mg by mouth daily at 12 noon.    rosuvastatin  (CRESTOR ) 20 MG tablet TAKE 1 TABLET BY MOUTH EVERY DAY    Spacer/Aero-Holding Chambers (AEROCHAMBER MV) inhaler Use as instructed    triamcinolone  cream (KENALOG ) 0.1 % Apply 1 application. topically 2 (two) times daily. On areas that itch    [DISCONTINUED] empagliflozin  (JARDIANCE ) 10 MG TABS tablet Take 1 tablet (10 mg total) by mouth daily.    [DISCONTINUED] hydrochlorothiazide  (HYDRODIURIL ) 25 MG tablet Take 1 tablet (25 mg total) by mouth daily. 12/17/2023: transition to single pill (combined)   [DISCONTINUED] ketoconazole  (NIZORAL ) 2 % cream Apply 1 Application topically daily.    [DISCONTINUED] losartan  (COZAAR ) 100 MG tablet Take 1 tablet (100 mg total) by mouth daily. 12/17/2023: transition to single pill (combined)   cyanocobalamin  (VITAMIN B12) 500 MCG tablet Take 1 tablet (500 mcg total) by mouth daily. (Patient not taking: Reported on 12/17/2023)    [DISCONTINUED] Blood Glucose Monitoring Suppl DEVI 1 each by Does not apply route daily before breakfast. May substitute to any manufacturer covered by patient's insurance. (Patient not taking: Reported on 12/17/2023)    [DISCONTINUED] Glucose Blood (BLOOD GLUCOSE TEST STRIPS) STRP 1 each by In Vitro route daily before breakfast. May substitute to any manufacturer covered by patient's insurance. (Patient not taking: Reported on 11/12/2023)    [DISCONTINUED] Multiple Vitamins-Minerals (MENS MULTIVITAMIN PO)  Take by mouth. (Patient not taking: Reported on 11/12/2023)    No facility-administered medications prior to visit.    Review of Systems  Constitutional:  Negative for appetite change, chills, fatigue and fever.  HENT:  Negative for congestion, ear pain, hearing loss, nosebleeds and trouble swallowing.   Eyes:  Negative for pain and visual disturbance.  Respiratory:  Negative for cough, chest tightness and shortness of breath.   Cardiovascular:  Negative for chest pain, palpitations and leg swelling.  Gastrointestinal:  Negative for abdominal pain, blood in stool, constipation, diarrhea, nausea and vomiting.  Endocrine: Negative for polydipsia, polyphagia and polyuria.  Genitourinary:  Negative for dysuria and flank pain.  Musculoskeletal:  Positive for arthralgias (legs are sore). Negative for back pain, joint swelling, myalgias and neck stiffness.  Skin:  Negative for color change, rash and wound.  Neurological:  Negative for dizziness, tremors, seizures, speech difficulty, weakness, light-headedness and headaches.  Psychiatric/Behavioral:  Negative for behavioral problems, confusion, decreased concentration, dysphoric mood and sleep disturbance. The patient is not nervous/anxious.   All other systems reviewed and are negative.     Objective    BP (!) 147/80 (BP Location: Left Arm, Patient Position: Sitting, Cuff Size: Normal)   Pulse 75   Temp 97.7 F (36.5 C) (Oral)   Ht 6' 1 (1.854 m)   Wt 209 lb 8 oz (95 kg)   SpO2 98%   BMI 27.64 kg/m    Physical Exam Vitals and nursing note reviewed.  Constitutional:      General: He is awake.     Appearance: Normal appearance.  HENT:     Head: Normocephalic and atraumatic.     Right Ear: Tympanic membrane, ear canal and external ear normal.     Left Ear: Tympanic membrane, ear canal and external ear normal.     Nose: Nose normal.     Mouth/Throat:     Mouth: Mucous membranes are moist.     Pharynx: Oropharynx is clear. No  oropharyngeal exudate or posterior oropharyngeal erythema.  Eyes:     General: No scleral icterus.    Extraocular Movements: Extraocular movements intact.     Conjunctiva/sclera: Conjunctivae normal.     Pupils: Pupils are equal, round, and reactive to light.  Neck:     Thyroid: No thyromegaly or thyroid tenderness.  Cardiovascular:     Rate and Rhythm: Normal rate and regular rhythm.     Pulses: Normal pulses.     Heart sounds: Normal heart sounds.  Pulmonary:     Effort: Pulmonary effort is normal. No tachypnea, bradypnea or respiratory distress.     Breath sounds: Normal breath sounds. No stridor. No wheezing, rhonchi or rales.  Abdominal:     General: Bowel sounds are normal. There is no distension.     Palpations: Abdomen is soft. There is no mass.     Tenderness: There is no abdominal tenderness. There is no guarding.     Hernia: No hernia is present.  Musculoskeletal:     Cervical back: Normal range of motion and neck supple.     Right lower leg: No edema.     Left lower leg: No edema.       Feet:  Feet:     Comments: Pain at base of 5th metatarsal on left foot. Lymphadenopathy:     Cervical: No cervical adenopathy.  Skin:    General: Skin is warm and dry.  Neurological:     Mental Status: He is alert and oriented to person, place, and time. Mental status is at baseline.  Psychiatric:        Mood and Affect: Mood normal.        Behavior: Behavior normal.      Last depression screening scores    12/17/2023    1:52 PM 09/03/2023   11:04 AM 08/05/2022    2:30 PM  PHQ 2/9 Scores  PHQ - 2 Score 2 0 0  PHQ- 9 Score 11  1      Data saved with a previous flowsheet row definition   Last fall risk screening    12/17/2023    1:52 PM  Fall Risk   Falls in the past year? 0  Number falls in past yr: 0  Injury with Fall? 0   Risk for fall due to : No Fall Risks     Data saved with a previous flowsheet row definition   Last Audit-C alcohol use screening     12/17/2023    1:47 PM  Alcohol Use Disorder Test (AUDIT)  1. How often do you have a drink containing alcohol? 0  2. How many drinks containing alcohol do you have on a typical day when you are drinking? 0  3. How often do you have six or more drinks on one occasion? 0  AUDIT-C Score 0   A score of 3 or more in women, and 4 or more in men indicates increased risk for alcohol abuse, EXCEPT if all of the points are from question 1   Results for orders placed or performed in visit on 12/17/23  Hemoglobin A1c  Result Value Ref Range   Hgb A1c MFr Bld 6.5 (H) 4.8 - 5.6 %   Est. average glucose Bld gHb Est-mCnc 140 mg/dL  Lipid Panel With LDL/HDL Ratio  Result Value Ref Range   Cholesterol, Total 145 100 - 199 mg/dL   Triglycerides 872 0 - 149 mg/dL   HDL 42 >60 mg/dL   VLDL Cholesterol Cal 23 5 - 40 mg/dL  LDL Chol Calc (NIH) 80 0 - 99 mg/dL   LDL/HDL Ratio 1.9 0.0 - 3.6 ratio  Comprehensive metabolic panel with GFR  Result Value Ref Range   Glucose 149 (H) 70 - 99 mg/dL   BUN 11 8 - 27 mg/dL   Creatinine, Ser 9.11 0.76 - 1.27 mg/dL   eGFR 95 >40 fO/fpw/8.26   BUN/Creatinine Ratio 13 10 - 24   Sodium 137 134 - 144 mmol/L   Potassium 3.9 3.5 - 5.2 mmol/L   Chloride 100 96 - 106 mmol/L   CO2 23 20 - 29 mmol/L   Calcium  9.7 8.6 - 10.2 mg/dL   Total Protein 7.6 6.0 - 8.5 g/dL   Albumin 4.7 3.9 - 4.9 g/dL   Globulin, Total 2.9 1.5 - 4.5 g/dL   Bilirubin Total 0.4 0.0 - 1.2 mg/dL   Alkaline Phosphatase 71 47 - 123 IU/L   AST 49 (H) 0 - 40 IU/L   ALT 70 (H) 0 - 44 IU/L    Assessment & Plan    Routine Health Maintenance and Physical Exam  Exercise Activities and Dietary recommendations  Goals   None     Immunization History  Administered Date(s) Administered   Influenza,inj,Quad PF,6+ Mos 10/10/2021   PFIZER(Purple Top)SARS-COV-2 Vaccination 04/15/2019, 05/13/2019, 01/07/2020   Tdap 10/11/2014   Vaccinia,smallpox Monkeypox Vaccine Live,pf 10/21/2020    Health  Maintenance  Topic Date Due   Zoster Vaccines- Shingrix (1 of 2) 03/18/2024 (Originally 06/16/1979)   Influenza Vaccine  04/27/2024 (Originally 08/29/2023)   Pneumococcal Vaccine: 50+ Years (1 of 2 - PCV) 09/02/2024 (Originally 06/16/1977)   Hepatitis C Screening  09/02/2024 (Originally 06/16/1976)   HIV Screening  09/02/2024 (Originally 06/16/1973)   COVID-19 Vaccine (4 - 2025-26 season) 09/28/2024 (Originally 09/29/2023)   Colonoscopy  05/23/2024   HEMOGLOBIN A1C  06/15/2024   OPHTHALMOLOGY EXAM  08/07/2024   Diabetic kidney evaluation - Urine ACR  09/02/2024   FOOT EXAM  09/02/2024   DTaP/Tdap/Td (2 - Td or Tdap) 10/10/2024   Diabetic kidney evaluation - eGFR measurement  12/16/2024   Medicare Annual Wellness (AWV)  12/16/2024   Hepatitis B Vaccines 19-59 Average Risk  Aged Out   Meningococcal B Vaccine  Aged Out    Discussed health benefits of physical activity, and encouraged him to engage in regular exercise appropriate for his age and condition.   Encounter for Medicare annual wellness exam  Annual physical exam  Type 2 diabetes mellitus with hyperglycemia, without long-term current use of insulin (HCC) -     Hemoglobin A1c -     Ambulatory referral to Podiatry  Mixed diabetic hyperlipidemia associated with type 2 diabetes mellitus (HCC) -     Lipid Panel With LDL/HDL Ratio -     Empagliflozin ; Take 1 tablet (10 mg total) by mouth daily.  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  Dispense: 90 tablet; Refill: 1 -     amLODIPine  Besylate; Take 1 tablet (5 mg total) by mouth daily.  Dispense: 90 tablet; Refill: 0  Foot pain, left -     DG Foot Complete Left; Future -     Ambulatory referral to Podiatry  Tinea pedis of left foot -     Ketoconazole ; Apply 1 Application topically  daily. To left foot  between toes (especially between 4th and 5th toes) for two weeks or until infection resolves.  Dispense: 60 g; Refill: 0  Family history of Alzheimer disease      Annual physical exam Routine visit with elevated blood pressure.  Physical exam overall unremarkable except as noted above. Routine lab work ordered as noted.  Cognitive assessment showed minor memory issues, no dementia concerns. - Ordered blood work including A1c. - Scheduled follow-up in 3 months for diabetes, hypertension, and chronic issues. - Discussed Medicare annual wellness visit and cognitive assessment. - Provided after visit summary.  Type 2 diabetes mellitus with hyperglycemia, without long-term current use of insulin Not checking blood sugars regularly. Emphasized importance of blood sugar control. - Ordered A1c test. - Encouraged regular blood sugar monitoring.  Mixed diabetic hyperlipidemia associated with type 2 diabetes mellitus - Recheck lipid panel - Continue rosuvastatin  20 mg daily  Atherosclerosis of native arteries of both lower extremities with rest pain Patient has been off Eliquis  for a month.  Consider restarting today.  However, patient has an appointment with vascular surgery immediately following this appointment.   Communicated patient's lack of Eliquis  to vascular surgery. Will defer restart to specialist management.  - Per indication from vascular surgery subsequent to both appointments, he was restarted on Eliquis  5 mg twice daily with 6 refills to address ongoing phlebitis and is planned to remain on Eliquis  for at least 6 to 12 months.  Primary hypertension Chronic, blood pressure mildly elevated today. On losartan , hydrochlorothiazide  and amlodipine . Plan to combine medications for convenience. - Combined losartan  with hydrochlorothiazide  into one pill. - Scheduled follow-up in 3 months.  Pain in left foot Pain possibly due to nodule or lack of shoe support.  Differential includes fracture or structural issues. - Ordered x-ray of left foot. - Referred to podiatry for further evaluation. - Recommended new shoes or shoe inserts for better support.  Tinea pedis (athlete's foot) Fungal infection with soreness and sensitivity. Previous antifungal treatment. - Referred to podiatry for further management. - Advised wearing clean socks daily and allowing feet to air out at home.  Family history of Alzheimer disease Patient did have slightly reduced cognitive score on 6-CIT screening.  However, it is unclear how much of this is true reduced cognition versus reflective of mild intellectual disability.  Will continue to monitor.     Return in about 3 months (around 03/18/2024) for DM, HTN, and in 6 month for Chronic f/u with new provider.     I discussed the assessment and treatment plan with the patient  The patient was provided an opportunity to ask questions and all were answered. The patient agreed with the plan and demonstrated an understanding of the instructions.   The patient was advised to call back or seek an in-person evaluation if the symptoms worsen or if the condition fails to improve as anticipated.    LAURAINE LOISE BUOY, DO  [facility] [phone] (phone) [phone] (fax)  [facility]   __________________________________________________________     Chief Complaint  Patient presents with   Annual Exam    Diet- General Exercise- No Overall feeling- Alright Sleep- Pretty good Concerns- Left foot has a sore, knot on the side hurts when he walks     Subjective:   ASMAR BROZEK is a 63 y.o. male who presents for a Medicare Annual Wellness Visit.  Allergies (verified) Patient has no known allergies.   History: Past Medical History:  Diagnosis Date  Acid reflux disease    Diabetes mellitus without complication (HCC)    Hypertension    Past Surgical History:  Procedure  Laterality Date   COLONOSCOPY WITH PROPOFOL  N/A 05/23/2021   Procedure: COLONOSCOPY WITH PROPOFOL ;  Surgeon: Therisa Bi, MD;  Location: [facility] ENDOSCOPY;  Service: Gastroenterology;  Laterality: N/A;   LOWER EXTREMITY ANGIOGRAPHY Left 10/21/2023   Procedure: Lower Extremity Angiography;  Surgeon: Jama Cordella MATSU, MD;  Location: [facility] INVASIVE CV LAB;  Service: Cardiovascular;  Laterality: Left;   LOWER EXTREMITY INTERVENTION Left 10/21/2023   Procedure: LOWER EXTREMITY INTERVENTION;  Surgeon: Jama Cordella MATSU, MD;  Location: [facility] INVASIVE CV LAB;  Service: Cardiovascular;  Laterality: Left;   Family History  Problem Relation Age of Onset   Heart attack Father    Cancer Sister    Cirrhosis Sister    Heart attack Maternal Grandfather    Social History   Occupational History   Not on file  Tobacco Use   Smoking status: Never   Smokeless tobacco: Never  Vaping Use   Vaping status: Never Used  Substance and Sexual Activity   Alcohol use: Never   Drug use: Not Currently   Sexual activity: Yes   Tobacco Counseling Counseling given: N/A  SDOH Screenings   Food Insecurity: No Food Insecurity (12/17/2023)  Housing: Low Risk (12/17/2023)  Transportation Needs: No Transportation Needs (12/17/2023)  Utilities: Not At Risk (12/17/2023)  Alcohol Screen: Low Risk (12/17/2023)  Depression (PHQ2-9): High Risk (12/17/2023)  Financial Resource Strain: Low Risk (12/17/2023)  Physical Activity: Sufficiently Active (12/17/2023)  Social Connections: Moderately Isolated (12/17/2023)  Stress: No Stress Concern Present (12/17/2023)  Tobacco Use: Low Risk (12/17/2023)  Health Literacy: Inadequate Health Literacy (12/17/2023)   See flowsheets for full screening details  Depression Screen PHQ 2 & 9 Depression Scale- Over the past 2 weeks, how often have you been bothered by any of the following problems? Little interest or pleasure in doing things: 1 Feeling down, depressed, or hopeless (PHQ  Adolescent also includes...irritable): 1 PHQ-2 Total Score: 2 Trouble falling or staying asleep, or sleeping too much: 1 Feeling tired or having little energy: 3 Poor appetite or overeating (PHQ Adolescent also includes...weight loss): 1 Feeling bad about yourself - or that you are a failure or have let yourself or your family down: 1 Trouble concentrating on things, such as reading the newspaper or watching television (PHQ Adolescent also includes...like school work): 1 Moving or speaking so slowly that other people could have noticed. Or the opposite - being so fidgety or restless that you have been moving around a lot more than usual: 1 Thoughts that you would be better off dead, or of hurting yourself in some way: 1 PHQ-9 Total Score: 11 If you checked off any problems, how difficult have these problems made it for you to do your work, take care of things at home, or get along with other people?: Somewhat difficult     Goals Addressed   None    Functional Status Ambulation: Independent  Fall Screening Falls in the past year?: 0 Number of falls in past year: 0 Was there an injury with Fall?: 0 Fall Risk Category Calculator: 0 Patient Fall Risk Level: Low Fall Risk  Fall Risk Patient at Risk for Falls Due to: No Fall Risks Fall risk Follow up: Falls evaluation completed  Cognitive Assessment What year is it?: 0 points What month is it?: 0 points Give patient an address phrase to remember (5 components): Norleen Sharps, [address]  [place] About what time is it?: 0 points Count backwards from 20 to 1: 2 points Say the months of the year in reverse: 0 points Repeat the address phrase from earlier: 2 points 6 CIT Score: 4 points  Advance Directives (For Healthcare) Does Patient Have a Medical Advance Directive?: No Would patient like information on creating a medical advance directive?: Yes (Inpatient - patient defers creating a medical advance directive and declines information  at this time)        Objective:    Today's Vitals   12/17/23 1349 12/17/23 1419  BP: (!) 151/72 (!) 147/80  Pulse: 84 75  Temp: 97.7 F (36.5 C)   TempSrc: Oral   SpO2: 98%   Weight: 209 lb 8 oz (95 kg)   Height: 6' 1 (1.854 m)    Body mass index is 27.64 kg/m.  Current Medications (verified) Outpatient Encounter Medications as of 12/17/2023  Medication Sig   albuterol  (VENTOLIN  HFA) 108 (90 Base) MCG/ACT inhaler Inhale 2 puffs into the lungs every 6 (six) hours as needed for wheezing or shortness of breath.   amLODipine  (NORVASC ) 5 MG tablet Take 1 tablet (5 mg total) by mouth daily.   apixaban  (ELIQUIS ) 5 MG TABS tablet TAKE 1 TABLET BY MOUTH TWICE A DAY   aspirin  EC 81 MG tablet Take 1 tablet (81 mg total) by mouth daily. Swallow whole.   esomeprazole (NEXIUM) 20 MG capsule Take 20 mg by mouth daily at 12 noon.   losartan -hydrochlorothiazide  (HYZAAR) 100-25 MG tablet Take 1 tablet by mouth daily.   rosuvastatin  (CRESTOR ) 20 MG tablet TAKE 1 TABLET BY MOUTH EVERY DAY   Spacer/Aero-Holding Chambers (AEROCHAMBER MV) inhaler Use as instructed   triamcinolone  cream (KENALOG ) 0.1 % Apply 1 application. topically 2 (two) times daily. On areas that itch   [DISCONTINUED] empagliflozin  (JARDIANCE ) 10 MG TABS tablet Take 1 tablet (10 mg total) by mouth daily.   [DISCONTINUED] hydrochlorothiazide  (HYDRODIURIL ) 25 MG tablet Take 1 tablet (25 mg total) by mouth daily.   [DISCONTINUED] ketoconazole  (NIZORAL ) 2 % cream Apply 1 Application topically daily.   [DISCONTINUED] losartan  (COZAAR ) 100 MG tablet Take 1 tablet (100 mg total) by mouth daily.   cyanocobalamin  (VITAMIN B12) 500 MCG tablet Take 1 tablet (500 mcg total) by mouth daily. (Patient not taking: Reported on 12/17/2023)   empagliflozin  (JARDIANCE ) 10 MG TABS tablet Take 1 tablet (10 mg total) by mouth daily.   ketoconazole  (NIZORAL ) 2 % cream Apply 1 Application topically daily. To left foot between toes (especially between 4th  and 5th toes) for two weeks or until infection resolves.   [DISCONTINUED] Blood Glucose Monitoring Suppl DEVI 1 each by Does not apply route daily before breakfast. May substitute to any manufacturer covered by patient's insurance. (Patient not taking: Reported on 12/17/2023)   [DISCONTINUED] Glucose Blood (BLOOD GLUCOSE TEST STRIPS) STRP 1 each by In Vitro route daily before breakfast. May substitute to any manufacturer covered by patient's insurance. (Patient not taking: Reported on 11/12/2023)   [DISCONTINUED] Multiple Vitamins-Minerals (MENS MULTIVITAMIN PO) Take by mouth. (Patient not taking: Reported on 11/12/2023)   No facility-administered encounter medications on file as of 12/17/2023.   Hearing/Vision screen No results found. Immunizations and Health Maintenance Health Maintenance  Topic Date Due   Zoster Vaccines- Shingrix (1 of 2) 03/18/2024 (Originally 06/16/1979)   Influenza Vaccine  04/27/2024 (Originally 08/29/2023)   Pneumococcal Vaccine: 50+ Years (1 of 2 - PCV) 09/02/2024 (Originally 06/16/1979)   Hepatitis C Screening  09/02/2024 (Originally 06[birth year]16/1976)  HIV Screening  09/02/2024 (Originally [birth year]16/1975)   COVID-19 Vaccine (4 - 2025-26 season) 09/28/2024 (Originally 09/29/2023)   Colonoscopy  05/23/2024   HEMOGLOBIN A1C  06/15/2024   OPHTHALMOLOGY EXAM  08/07/2024   Diabetic kidney evaluation - Urine ACR  09/02/2024   FOOT EXAM  09/02/2024   DTaP/Tdap/Td (2 - Td or Tdap) 10/10/2024   Diabetic kidney evaluation - eGFR measurement  12/16/2024   Medicare Annual Wellness (AWV)  12/16/2024   Hepatitis B Vaccines 19-59 Average Risk  Aged Out   Meningococcal B Vaccine  Aged Out        Assessment/Plan:  This is a routine wellness examination for [facility].  Patient Care Team: Donzella Lauraine SAILOR, DO as PCP - General (Family Medicine)  I have personally reviewed and noted the following in the patients chart:   Medical and social history Use of alcohol, tobacco or illicit drugs   Current medications and supplements including opioid prescriptions. Functional ability and status Nutritional status Physical activity Advanced directives List of other physicians Hospitalizations, surgeries, and ER visits in previous 12 months Vitals Screenings to include cognitive, depression, and falls Referrals and appointments Patient is not currently on any opioid medications.   Orders Placed This Encounter  Procedures   DG Foot Complete Left    Standing Status:   Future    Expected Date:   12/17/2023    Expiration Date:   01/16/2024    Reason for Exam (SYMPTOM  OR DIAGNOSIS REQUIRED):   foot pain, base of fifth metatarsal    Preferred imaging location?:   [facility]   Hemoglobin A1c   Lipid Panel With LDL/HDL Ratio   Comprehensive metabolic panel with GFR   Ambulatory referral to Podiatry    Referral Priority:   Routine    Referral Type:   Consultation    Referral Reason:   Specialty Services Required    Requested Specialty:   Podiatry    Number of Visits Requested:   1   In addition, I have reviewed and discussed with patient certain preventive protocols, quality metrics, and best practice recommendations. A written personalized care plan for preventive services as well as general preventive health recommendations were provided to patient.   Myesha Stillion N Lanell Carpenter, DO   01/08/2024   Return in about 3 months (around 03/18/2024) for DM, HTN, and in 6 month for Chronic f/u with new provider.  After Visit Summary: (In Person-Printed) AVS printed and given to the patient

## 2023-12-18 LAB — COMPREHENSIVE METABOLIC PANEL WITH GFR
ALT: 70 IU/L — ABNORMAL HIGH (ref 0–44)
AST: 49 IU/L — ABNORMAL HIGH (ref 0–40)
Albumin: 4.7 g/dL (ref 3.9–4.9)
Alkaline Phosphatase: 71 IU/L (ref 47–123)
BUN/Creatinine Ratio: 13 (ref 10–24)
BUN: 11 mg/dL (ref 8–27)
Bilirubin Total: 0.4 mg/dL (ref 0.0–1.2)
CO2: 23 mmol/L (ref 20–29)
Calcium: 9.7 mg/dL (ref 8.6–10.2)
Chloride: 100 mmol/L (ref 96–106)
Creatinine, Ser: 0.88 mg/dL (ref 0.76–1.27)
Globulin, Total: 2.9 g/dL (ref 1.5–4.5)
Glucose: 149 mg/dL — ABNORMAL HIGH (ref 70–99)
Potassium: 3.9 mmol/L (ref 3.5–5.2)
Sodium: 137 mmol/L (ref 134–144)
Total Protein: 7.6 g/dL (ref 6.0–8.5)
eGFR: 95 mL/min/1.73 (ref >59)

## 2023-12-18 LAB — LIPID PANEL WITH LDL/HDL RATIO
Cholesterol, Total: 145 mg/dL (ref 100–199)
HDL: 42 mg/dL (ref >39)
LDL Chol Calc (NIH): 80 mg/dL (ref 0–99)
LDL/HDL Ratio: 1.9 ratio (ref 0.0–3.6)
Triglycerides: 127 mg/dL (ref 0–149)
VLDL Cholesterol Cal: 23 mg/dL (ref 5–40)

## 2023-12-18 LAB — HEMOGLOBIN A1C
Est. average glucose Bld gHb Est-mCnc: 140 mg/dL
Hgb A1c MFr Bld: 6.5 % — ABNORMAL HIGH (ref 4.8–5.6)

## 2023-12-19 ENCOUNTER — Other Ambulatory Visit (INDEPENDENT_AMBULATORY_CARE_PROVIDER_SITE_OTHER): Payer: Self-pay

## 2023-12-19 MED ORDER — APIXABAN 5 MG PO TABS: 5.0000 mg | ORAL_TABLET | Freq: Two times a day (BID) | ORAL | 6 refills | Status: AC

## 2023-12-20 ENCOUNTER — Other Ambulatory Visit: Payer: Self-pay | Admitting: Family Medicine

## 2023-12-20 DIAGNOSIS — I10 Essential (primary) hypertension: Secondary | ICD-10-CM

## 2023-12-22 ENCOUNTER — Ambulatory Visit: Payer: Self-pay | Admitting: Family Medicine

## 2024-02-10 ENCOUNTER — Ambulatory Visit

## 2024-02-10 VITALS — BP 152/76 | Ht 73.0 in | Wt 210.1 lb

## 2024-02-10 DIAGNOSIS — Z0000 Encounter for general adult medical examination without abnormal findings: Secondary | ICD-10-CM | POA: Diagnosis not present

## 2024-02-10 DIAGNOSIS — Z1211 Encounter for screening for malignant neoplasm of colon: Secondary | ICD-10-CM | POA: Diagnosis not present

## 2024-02-10 NOTE — Progress Notes (Signed)
 "  Chief Complaint  Patient presents with   Medicare Wellness     Subjective:   Carl Crawford is a 64 y.o. male who presents for a Medicare Annual Wellness Visit.  Visit info / Clinical Intake: Medicare Wellness Visit Type:: Initial Annual Wellness Visit Persons participating in visit and providing information:: patient Medicare Wellness Visit Mode:: In-person (required for WTM) Interpreter Needed?: No Pre-visit prep was completed: yes AWV questionnaire completed by patient prior to visit?: no Living arrangements:: (!) lives alone Patient's Overall Health Status Rating: (!) fair Typical amount of pain: some Does pain affect daily life?: no Are you currently prescribed opioids?: no  Dietary Habits and Nutritional Risks How many meals a day?: 3 Eats fruit and vegetables daily?: yes Most meals are obtained by: preparing own meals In the last 2 weeks, have you had any of the following?: none Diabetic:: (!) yes Any non-healing wounds?: no How often do you check your BS?: 0 Would you like to be referred to a Nutritionist or for Diabetic Management? : no  Functional Status Activities of Daily Living (to include ambulation/medication): Independent Ambulation: Independent Medication Administration: Independent Home Management (perform basic housework or laundry): Independent Manage your own finances?: yes Primary transportation is: driving Concerns about vision?: no *vision screening is required for WTM* (glasses all day- [facility]) Concerns about hearing?: no  Fall Screening Falls in the past year?: 0 Number of falls in past year: 0 Was there an injury with Fall?: 0 Fall Risk Category Calculator: 0 Patient Fall Risk Level: Low Fall Risk  Fall Risk Patient at Risk for Falls Due to: No Fall Risks Fall risk Follow up: Falls evaluation completed; Falls prevention discussed  Home and Transportation Safety: All rugs have non-skid backing?: yes All stairs or steps have  railings?: (!) no Grab bars in the bathtub or shower?: (!) no Have non-skid surface in bathtub or shower?: yes Good home lighting?: yes Regular seat belt use?: yes Hospital stays in the last year:: no  Cognitive Assessment Difficulty concentrating, remembering, or making decisions? : no Will 6CIT or Mini Cog be Completed: yes What year is it?: 0 points What month is it?: 0 points Give patient an address phrase to remember (5 components): [address] [place], [place] About what time is it?: 0 points Count backwards from 20 to 1: 0 points Say the months of the year in reverse: 0 points Repeat the address phrase from earlier: 0 points 6 CIT Score: 0 points  Advance Directives (For Healthcare) Does Patient Have a Medical Advance Directive?: No Would patient like information on creating a medical advance directive?: No - Patient declined  Reviewed/Updated  Reviewed/Updated: Reviewed All (Medical, Surgical, Family, Medications, Allergies, Care Teams, Patient Goals)    Allergies (verified) Patient has no known allergies.   Current Medications (verified) Outpatient Encounter Medications as of 02/10/2024  Medication Sig   albuterol  (VENTOLIN  HFA) 108 (90 Base) MCG/ACT inhaler Inhale 2 puffs into the lungs every 6 (six) hours as needed for wheezing or shortness of breath.   amLODipine  (NORVASC ) 5 MG tablet Take 1 tablet (5 mg total) by mouth daily.   apixaban  (ELIQUIS ) 5 MG TABS tablet TAKE 1 TABLET BY MOUTH TWICE A DAY   aspirin  EC 81 MG tablet Take 1 tablet (81 mg total) by mouth daily. Swallow whole.   cyanocobalamin  (VITAMIN B12) 500 MCG tablet Take 1 tablet (500 mcg total) by mouth daily.   empagliflozin  (JARDIANCE ) 10 MG TABS tablet Take 1 tablet (10  mg total) by mouth daily.   esomeprazole (NEXIUM) 20 MG capsule Take 20 mg by mouth daily at 12 noon.   ketoconazole  (NIZORAL ) 2 % cream Apply 1 Application topically daily. To left foot between toes (especially between 4th and 5th  toes) for two weeks or until infection resolves.   losartan -hydrochlorothiazide  (HYZAAR) 100-25 MG tablet Take 1 tablet by mouth daily.   rosuvastatin  (CRESTOR ) 20 MG tablet TAKE 1 TABLET BY MOUTH EVERY DAY   Spacer/Aero-Holding Chambers (AEROCHAMBER MV) inhaler Use as instructed   triamcinolone  cream (KENALOG ) 0.1 % Apply 1 application. topically 2 (two) times daily. On areas that itch   apixaban  (ELIQUIS ) 5 MG TABS tablet Take 1 tablet (5 mg total) by mouth 2 (two) times daily.   No facility-administered encounter medications on file as of 02/10/2024.    History: Past Medical History:  Diagnosis Date   Acid reflux disease    Diabetes mellitus without complication (HCC)    Hypertension    Past Surgical History:  Procedure Laterality Date   COLONOSCOPY WITH PROPOFOL  N/A 05/23/2021   Procedure: COLONOSCOPY WITH PROPOFOL ;  Surgeon: Therisa Bi, MD;  Location: [facility] ENDOSCOPY;  Service: Gastroenterology;  Laterality: N/A;   LOWER EXTREMITY ANGIOGRAPHY Left 10/21/2023   Procedure: Lower Extremity Angiography;  Surgeon: Jama Cordella MATSU, MD;  Location: [facility] INVASIVE CV LAB;  Service: Cardiovascular;  Laterality: Left;   LOWER EXTREMITY INTERVENTION Left 10/21/2023   Procedure: LOWER EXTREMITY INTERVENTION;  Surgeon: Jama Cordella MATSU, MD;  Location: [facility] INVASIVE CV LAB;  Service: Cardiovascular;  Laterality: Left;   Family History  Problem Relation Age of Onset   Heart attack Father    Cancer Sister    Cirrhosis Sister    Heart attack Maternal Grandfather    Social History   Occupational History   Not on file  Tobacco Use   Smoking status: Never   Smokeless tobacco: Never  Vaping Use   Vaping status: Never Used  Substance and Sexual Activity   Alcohol use: Never   Drug use: Not Currently   Sexual activity: Yes   Tobacco Counseling Counseling given: Not Answered  SDOH Screenings   Food Insecurity: No Food Insecurity (02/10/2024)  Housing: Unknown (02/10/2024)   Transportation Needs: No Transportation Needs (02/10/2024)  Utilities: Not At Risk (02/10/2024)  Alcohol Screen: Low Risk (12/17/2023)  Depression (PHQ2-9): Low Risk (02/10/2024)  Recent Concern: Depression (PHQ2-9) - High Risk (12/17/2023)  Financial Resource Strain: Low Risk (12/17/2023)  Physical Activity: Sufficiently Active (02/10/2024)  Social Connections: Moderately Isolated (02/10/2024)  Stress: No Stress Concern Present (02/10/2024)  Tobacco Use: Low Risk (02/10/2024)  Health Literacy: Inadequate Health Literacy (02/10/2024)   See flowsheets for full screening details  Depression Screen PHQ 2 & 9 Depression Scale- Over the past 2 weeks, how often have you been bothered by any of the following problems? Little interest or pleasure in doing things: 0 Feeling down, depressed, or hopeless (PHQ Adolescent also includes...irritable): 0 PHQ-2 Total Score: 0 Trouble falling or staying asleep, or sleeping too much: 0 Feeling tired or having little energy: 0 Poor appetite or overeating (PHQ Adolescent also includes...weight loss): 0 Feeling bad about yourself - or that you are a failure or have let yourself or your family down: 0 Trouble concentrating on things, such as reading the newspaper or watching television (PHQ Adolescent also includes...like school work): 0 Moving or speaking so slowly that other people could have noticed. Or the opposite - being so fidgety or restless that you have been moving around  a lot more than usual: 0 Thoughts that you would be better off dead, or of hurting yourself in some way: 0 PHQ-9 Total Score: 0 If you checked off any problems, how difficult have these problems made it for you to do your work, take care of things at home, or get along with other people?: Not difficult at all     Goals Addressed             This Visit's Progress    DIET - EAT MORE FRUITS AND VEGETABLES               Objective:    Today's Vitals   02/10/24 1541  BP: (!)  152/76  Weight: 210 lb 1.6 oz (95.3 kg)  Height: 6' 1 (1.854 m)   Body mass index is 27.72 kg/m.  Hearing/Vision screen Hearing Screening - Comments:: NO AIDS Vision Screening - Comments:: GLASSES ALL DAY- [facility]- YEARLY Immunizations and Health Maintenance Health Maintenance  Topic Date Due   Colonoscopy  05/23/2024   Zoster Vaccines- Shingrix (1 of 2) 03/18/2024 (Originally 06/16/1979)   Influenza Vaccine  04/27/2024 (Originally 08/29/2023)   Pneumococcal Vaccine: 50+ Years (1 of 2 - PCV) 09/02/2024 (Originally 06/16/1979)   Hepatitis C Screening  09/02/2024 (Originally 06/16/1978)   HIV Screening  09/02/2024 (Originally 06/16/1975)   COVID-19 Vaccine (4 - 2025-26 season) 09/28/2024 (Originally 09/29/2023)   HEMOGLOBIN A1C  06/15/2024   OPHTHALMOLOGY EXAM  08/07/2024   Diabetic kidney evaluation - Urine ACR  09/02/2024   FOOT EXAM  09/02/2024   DTaP/Tdap/Td (2 - Td or Tdap) 10/10/2024   Diabetic kidney evaluation - eGFR measurement  12/16/2024   Medicare Annual Wellness (AWV)  02/09/2025   Hepatitis B Vaccines 19-59 Average Risk  Aged Out   Meningococcal B Vaccine  Aged Out        Assessment/Plan:  This is a routine wellness examination for [facility].  Patient Care Team: Donzella Lauraine SAILOR, DO as PCP - General (Family Medicine) [facility], [facility])  I have personally reviewed and noted the following in the patients chart:   Medical and social history Use of alcohol, tobacco or illicit drugs  Current medications and supplements including opioid prescriptions. Functional ability and status Nutritional status Physical activity Advanced directives List of other physicians Hospitalizations, surgeries, and ER visits in previous 12 months Vitals Screenings to include cognitive, depression, and falls Referrals and appointments  No orders of the defined types were placed in this encounter.  In addition, I have reviewed and discussed with patient certain  preventive protocols, quality metrics, and best practice recommendations. A written personalized care plan for preventive services as well as general preventive health recommendations were provided to patient.   Jhonnie GORMAN Das, LPN   [id number]   Return in 1 year (on 02/09/2025).  After Visit Summary: (In Person-Printed) AVS printed and given to the patient  Nurse Notes: DECLINES FLU, PNA, COVID; REFERRAL FOR COLONOSCOPY   "

## 2024-02-10 NOTE — Patient Instructions (Addendum)
 Mr. Carl Crawford,  Thank you for taking the time for your Medicare Wellness Visit. I appreciate your continued commitment to your health goals. Please review the care plan we discussed, and feel free to reach out if I can assist you further.  Please note that Annual Wellness Visits do not include a physical exam. Some assessments may be limited, especially if the visit was conducted virtually. If needed, we may recommend an in-person follow-up with your provider.  Ongoing Care Seeing your primary care provider every 3 to 6 months helps us  monitor your health and provide consistent, personalized care. 03/19/24 @ 1:20 PM W/ DR.PARDUE  Referrals If a referral was made during today's visit and you haven't received any updates within two weeks, please contact the referred provider directly to check on the status.  REFERRAL SENT FOR COLONOSCOPY  Recommended Screenings:  Health Maintenance  Topic Date Due   Colon Cancer Screening  05/23/2024   Zoster (Shingles) Vaccine (1 of 2) 03/18/2024*   Flu Shot  04/27/2024*   Pneumococcal Vaccine for age over 12 (1 of 2 - PCV) 09/02/2024*   Hepatitis C Screening  09/02/2024*   HIV Screening  09/02/2024*   COVID-19 Vaccine (4 - 2025-26 season) 09/28/2024*   Hemoglobin A1C  06/15/2024   Eye exam for diabetics  08/07/2024   Yearly kidney health urinalysis for diabetes  09/02/2024   Complete foot exam   09/02/2024   DTaP/Tdap/Td vaccine (2 - Td or Tdap) 10/10/2024   Yearly kidney function blood test for diabetes  12/16/2024   Medicare Annual Wellness Visit  02/09/2025   Hepatitis B Vaccine  Aged Out   Meningitis B Vaccine  Aged Out  *Topic was postponed. The date shown is not the original due date.    Vision: Annual vision screenings are recommended for early detection of glaucoma, cataracts, and diabetic retinopathy. These exams can also reveal signs of chronic conditions such as diabetes and high blood pressure.  Dental: Annual dental screenings help  detect early signs of oral cancer, gum disease, and other conditions linked to overall health, including heart disease and diabetes.  Please see the attached documents for additional preventive care recommendations.   NEXT AWV  02/15/25 @ 2:30 PM IN PERSON

## 2024-02-11 ENCOUNTER — Other Ambulatory Visit (INDEPENDENT_AMBULATORY_CARE_PROVIDER_SITE_OTHER): Payer: Self-pay | Admitting: Vascular Surgery

## 2024-02-11 DIAGNOSIS — I739 Peripheral vascular disease, unspecified: Secondary | ICD-10-CM

## 2024-02-11 NOTE — Progress Notes (Signed)
 "                                                                      MRN : [id number]  Carl Crawford is a 64 y.o. (1960-09-22) male who presents with chief complaint of check circulation.  History of Present Illness:   The patient returns to the office for followup regarding atherosclerotic changes of the lower extremities and review of the noninvasive studies.   He is status post angiogram with intervention October 21, 2023.  Procedure:  1.  Mechanical thrombectomy left SFA and above-knee popliteal using the [place] thrombectomy catheter            2.   Percutaneous transluminal angioplasty and stent placement left superficial femoral artery and popliteal.  There have been no interval changes in lower extremity symptoms. No interval shortening of the patient's claudication distance or development of rest pain symptoms. No new ulcers or wounds have occurred since the last visit.  He continues to have an area of the lateral left foot which is raised and very painful.  Initially he was told it should go away or get better after his revascularization but it has not and that is making it very difficult for him to walk.  There have been no significant changes to the patient's overall health care.  The patient denies amaurosis fugax or recent TIA symptoms. There are no documented recent neurological changes noted. There is no history of DVT, PE or superficial thrombophlebitis. The patient denies recent episodes of angina or shortness of breath.   ABI Rt=0.76 and Lt=1.04  (previous ABI's Rt=0.75 and Lt=1.12) Duplex ultrasound of the left lower extremity demonstrates uniform velocities throughout the SFA and popliteal.  Arterial reconstruction is widely patent.  Active Medications[1]  Past Medical History:  Diagnosis Date   Acid reflux disease    Diabetes mellitus without complication (HCC)    Hypertension     Past Surgical History:  Procedure Laterality Date   COLONOSCOPY WITH  PROPOFOL  N/A 05/23/2021   Procedure: COLONOSCOPY WITH PROPOFOL ;  Surgeon: Therisa Bi, MD;  Location: [facility] ENDOSCOPY;  Service: Gastroenterology;  Laterality: N/A;   LOWER EXTREMITY ANGIOGRAPHY Left 10/21/2023   Procedure: Lower Extremity Angiography;  Surgeon: Jama Cordella MATSU, MD;  Location: [facility] INVASIVE CV LAB;  Service: Cardiovascular;  Laterality: Left;   LOWER EXTREMITY INTERVENTION Left 10/21/2023   Procedure: LOWER EXTREMITY INTERVENTION;  Surgeon: Jama Cordella MATSU, MD;  Location: [facility] INVASIVE CV LAB;  Service: Cardiovascular;  Laterality: Left;    Social History Social History[2]  Family History Family History  Problem Relation Age of Onset   Heart attack Father    Cancer Sister    Cirrhosis Sister    Heart attack Maternal Grandfather     Allergies[3]   REVIEW OF SYSTEMS (Negative unless checked)  Constitutional: [] Weight loss  [] Fever  [] Chills Cardiac: [] Chest pain   [] Chest pressure   [] Palpitations   [] Shortness of breath when laying flat   [] Shortness of breath with exertion. Vascular:  [x] Pain in legs with walking   [] Pain in legs at rest  [] History of DVT   [] Phlebitis   [] Swelling in legs   [] Varicose veins   [] Non-healing ulcers Pulmonary:   [] Uses home oxygen   []   Productive cough   [] Hemoptysis   [] Wheeze  [] COPD   [] Asthma Neurologic:  [] Dizziness   [] Seizures   [] History of stroke   [] History of TIA  [] Aphasia   [] Vissual changes   [] Weakness or numbness in arm   [] Weakness or numbness in leg Musculoskeletal:   [] Joint swelling   [] Joint pain   [] Low back pain Hematologic:  [] Easy bruising  [] Easy bleeding   [] Hypercoagulable state   [] Anemic Gastrointestinal:  [] Diarrhea   [] Vomiting  [] Gastroesophageal reflux/heartburn   [] Difficulty swallowing. Genitourinary:  [] Chronic kidney disease   [] Difficult urination  [] Frequent urination   [] Blood in urine Skin:  [] Rashes   [] Ulcers  Psychological:  [] History of anxiety   []  History of major  depression.  Physical Examination  There were no vitals filed for this visit. There is no height or weight on file to calculate BMI. Gen: WD/WN, NAD Head: [place]/AT, No temporalis wasting.  Ear/Nose/Throat: Hearing grossly intact, nares w/o erythema or drainage Eyes: PER, EOMI, sclera nonicteric.  Neck: Supple, no masses.  No bruit or JVD.  Pulmonary:  Good air movement, no audible wheezing, no use of accessory muscles.  Cardiac: RRR, normal S1, S2, no Murmurs. Vascular:  mild trophic changes, no open wounds on the lateral aspect of his foot about midway from the malleolus to the toes there is a firm raised nodule that is tender to palpation.  Skin is supple and completely healthy no evidence for erythema or induration.  This feels more bony in nature. Vessel Right Left  Radial Palpable Palpable  PT Not Palpable Trace palpable  DP Not Palpable Trace palpable  Gastrointestinal: soft, non-distended. No guarding/no peritoneal signs.  Musculoskeletal: M/S 5/5 throughout.  No visible deformity.  Neurologic: CN 2-12 intact. Pain and light touch intact in extremities.  Symmetrical.  Speech is fluent. Motor exam as listed above. Psychiatric: Judgment intact, Mood & affect appropriate for pt's clinical situation. Dermatologic: No rashes or ulcers noted.  No changes consistent with cellulitis.   CBC Lab Results  Component Value Date   WBC 6.3 07/18/2023   HGB 16.1 07/18/2023   HCT 44.4 07/18/2023   MCV 85.7 07/18/2023   PLT 277 07/18/2023    BMET    Component Value Date/Time   NA 137 12/17/2023 1438   K 3.9 12/17/2023 1438   CL 100 12/17/2023 1438   CO2 23 12/17/2023 1438   GLUCOSE 149 (H) 12/17/2023 1438   GLUCOSE 120 (H) 07/18/2023 1724   BUN 11 12/17/2023 1438   CREATININE 0.88 12/17/2023 1438   CALCIUM  9.7 12/17/2023 1438   GFRNONAA >60 10/21/2023 0934   CrCl cannot be calculated (Patient's most recent lab result is older than the maximum 21 days allowed.).  COAG Lab Results   Component Value Date   INR 1.1 07/18/2023    Radiology No results found.   Assessment/Plan 1. Atherosclerosis of native artery of both lower extremities with rest pain (HCC) (Primary) Recommend:  The patient is status post successful angiogram with intervention.  The patient reports that the claudication symptoms and leg pain has improved.   The patient denies lifestyle limiting changes at this point in time.  No further invasive studies, angiography or surgery at this time. The patient should continue walking and begin a more formal exercise program.  The patient should continue antiplatelet therapy and aggressive treatment of the lipid abnormalities  Continued surveillance is indicated as atherosclerosis is likely to progress with time.    Patient should undergo noninvasive studies as  ordered. The patient will follow up with me to review the studies.  - VAS US  LOWER EXTREMITY ARTERIAL DUPLEX; Future - VAS US  ABI WITH/WO TBI; Future  2. Chronic venous insufficiency No surgery or intervention at this point in time.   The patient is CEAP [id number]   I have discussed with the patient venous insufficiency and why it  causes symptoms. I have discussed with the patient the chronic skin changes that accompany venous insufficiency and the long term sequela such as infection and ulceration.  Patient will begin wearing graduated compression stockings or compression wraps on a daily basis.  The patient will put the compression on first thing in the morning and removing them in the evening. The patient is instructed specifically not to sleep in the compression.    In addition, behavioral modification including several periods of elevation of the lower extremities during the day will be continued. I have demonstrated that proper elevation is a position with the ankles at heart level.  The patient is instructed to begin routine exercise, especially walking on a daily basis  The patient will  be assessed for a Lymph Pump depending on the effectiveness of conservative therapy and the control of the associated lymphedema.  3. Primary hypertension Continue antihypertensive medications as already ordered, these medications have been reviewed and there are no changes at this time.  4. Type 2 diabetes mellitus with hyperglycemia, without long-term current use of insulin (HCC) Continue hypoglycemic medications as already ordered, these medications have been reviewed and there are no changes at this time.  Hgb A1C to be monitored as already arranged by primary service  5. Mixed hyperlipidemia Continue statin as ordered and reviewed, no changes at this time  6. Foot pain, left There is an area of the left lateral foot that is very painful and creating problems for him when he ambulates.  It feels to be more of a bony prominence than anything else.  I will ask podiatry to evaluate. - Ambulatory referral to Podiatry    Cordella Shawl, MD  02/11/2024 1:35 PM      [1]  No outpatient medications have been marked as taking for the 02/12/24 encounter (Appointment) with Shawl, Cordella MATSU, MD.  [2]  Social History Tobacco Use   Smoking status: Never   Smokeless tobacco: Never  Vaping Use   Vaping status: Never Used  Substance Use Topics   Alcohol use: Never   Drug use: Not Currently  [3] No Known Allergies  "

## 2024-02-12 ENCOUNTER — Ambulatory Visit (INDEPENDENT_AMBULATORY_CARE_PROVIDER_SITE_OTHER): Admitting: Vascular Surgery

## 2024-02-12 ENCOUNTER — Ambulatory Visit (INDEPENDENT_AMBULATORY_CARE_PROVIDER_SITE_OTHER)

## 2024-02-12 ENCOUNTER — Encounter (INDEPENDENT_AMBULATORY_CARE_PROVIDER_SITE_OTHER): Payer: Self-pay | Admitting: Vascular Surgery

## 2024-02-12 VITALS — BP 149/78 | HR 76 | Resp 16 | Ht 73.0 in | Wt 208.0 lb

## 2024-02-12 DIAGNOSIS — I70223 Atherosclerosis of native arteries of extremities with rest pain, bilateral legs: Secondary | ICD-10-CM

## 2024-02-12 DIAGNOSIS — I872 Venous insufficiency (chronic) (peripheral): Secondary | ICD-10-CM

## 2024-02-12 DIAGNOSIS — E782 Mixed hyperlipidemia: Secondary | ICD-10-CM | POA: Diagnosis not present

## 2024-02-12 DIAGNOSIS — E1165 Type 2 diabetes mellitus with hyperglycemia: Secondary | ICD-10-CM | POA: Diagnosis not present

## 2024-02-12 DIAGNOSIS — I739 Peripheral vascular disease, unspecified: Secondary | ICD-10-CM

## 2024-02-12 DIAGNOSIS — I10 Essential (primary) hypertension: Secondary | ICD-10-CM | POA: Diagnosis not present

## 2024-02-12 DIAGNOSIS — Z9889 Other specified postprocedural states: Secondary | ICD-10-CM | POA: Diagnosis not present

## 2024-02-12 DIAGNOSIS — M79672 Pain in left foot: Secondary | ICD-10-CM | POA: Diagnosis not present

## 2024-02-16 LAB — VAS US ABI WITH/WO TBI
Left ABI: 1.04
Right ABI: 0.76

## 2024-02-18 ENCOUNTER — Other Ambulatory Visit: Payer: Self-pay

## 2024-02-18 ENCOUNTER — Telehealth: Payer: Self-pay

## 2024-02-18 DIAGNOSIS — Z860100 Personal history of colon polyps, unspecified: Secondary | ICD-10-CM

## 2024-02-18 MED ORDER — NA SULFATE-K SULFATE-MG SULF 17.5-3.13-1.6 GM/177ML PO SOLN: 354.0000 mL | Freq: Once | ORAL | 0 refills | Status: AC

## 2024-02-18 NOTE — Telephone Encounter (Signed)
 Gastroenterology Pre-Procedure Review  Request Date: 05/25/2024 Requesting Physician: Dr. Aloysius Nap  PATIENT REVIEW QUESTIONS: The patient responded to the following health history questions as indicated:    1. Are you having any GI issues? no 2. Do you have a personal history of Polyps? yes (05/24/2021 Dr. Therisa Polyps) 3. Do you have a family history of Colon Cancer or Polyps? no 4. Diabetes Mellitus? yes (Jardiance  hold for 2 days) 5. Joint replacements in the past 12 months?no 6. Major health problems in the past 3 months?no 7. Any artificial heart valves, MVP, or defibrillator?no    MEDICATIONS & ALLERGIES:    Patient reports the following regarding taking any anticoagulation/antiplatelet therapy:   Plavix, Coumadin, Eliquis , Xarelto, Lovenox, Pradaxa, Brilinta, or Effient? yes (Eliquis  ) Aspirin ? no  Patient confirms/reports the following medications:  Current Outpatient Medications  Medication Sig Dispense Refill   albuterol  (VENTOLIN  HFA) 108 (90 Base) MCG/ACT inhaler Inhale 2 puffs into the lungs every 6 (six) hours as needed for wheezing or shortness of breath. 8 g 2   amLODipine  (NORVASC ) 5 MG tablet Take 1 tablet (5 mg total) by mouth daily. 90 tablet 0   apixaban  (ELIQUIS ) 5 MG TABS tablet TAKE 1 TABLET BY MOUTH TWICE A DAY 90 tablet 1   apixaban  (ELIQUIS ) 5 MG TABS tablet Take 1 tablet (5 mg total) by mouth 2 (two) times daily. 60 tablet 6   aspirin  EC 81 MG tablet Take 1 tablet (81 mg total) by mouth daily. Swallow whole. 150 tablet 1   cyanocobalamin  (VITAMIN B12) 500 MCG tablet Take 1 tablet (500 mcg total) by mouth daily. 90 tablet 1   empagliflozin  (JARDIANCE ) 10 MG TABS tablet Take 1 tablet (10 mg total) by mouth daily. 90 tablet 1   esomeprazole (NEXIUM) 20 MG capsule Take 20 mg by mouth daily at 12 noon.     ketoconazole  (NIZORAL ) 2 % cream Apply 1 Application topically daily. To left foot between toes (especially between 4th and 5th toes) for two weeks or  until infection resolves. 60 g 0   losartan -hydrochlorothiazide  (HYZAAR) 100-25 MG tablet Take 1 tablet by mouth daily. 90 tablet 1   rosuvastatin  (CRESTOR ) 20 MG tablet TAKE 1 TABLET BY MOUTH EVERY DAY 90 tablet 3   Spacer/Aero-Holding Chambers (AEROCHAMBER MV) inhaler Use as instructed 1 each 0   triamcinolone  cream (KENALOG ) 0.1 % Apply 1 application. topically 2 (two) times daily. On areas that itch 45 g 2   No current facility-administered medications for this visit.    Patient confirms/reports the following allergies:  Allergies[1]  No orders of the defined types were placed in this encounter.   AUTHORIZATION INFORMATION Primary Insurance: 1D#: Group #:  Secondary Insurance: 1D#: Group #:  SCHEDULE INFORMATION: Date: 05/25/2024 Time: Location: [facility] Dr. Nap    [1] No Known Allergies

## 2024-03-03 ENCOUNTER — Other Ambulatory Visit: Payer: Self-pay | Admitting: Family Medicine

## 2024-03-03 DIAGNOSIS — I10 Essential (primary) hypertension: Secondary | ICD-10-CM

## 2024-03-19 ENCOUNTER — Ambulatory Visit: Admitting: Family Medicine

## 2024-05-25 ENCOUNTER — Ambulatory Visit: Admit: 2024-05-25 | Admitting: Gastroenterology

## 2024-05-25 SURGERY — COLONOSCOPY
Anesthesia: General

## 2024-08-09 ENCOUNTER — Encounter (INDEPENDENT_AMBULATORY_CARE_PROVIDER_SITE_OTHER)

## 2024-08-09 ENCOUNTER — Ambulatory Visit (INDEPENDENT_AMBULATORY_CARE_PROVIDER_SITE_OTHER): Admitting: Vascular Surgery

## 2025-02-15 ENCOUNTER — Ambulatory Visit
